# Patient Record
Sex: Male | Born: 1985 | Race: Black or African American | Hispanic: No | Marital: Single | Smoking: Never smoker
Health system: Southern US, Community
[De-identification: ages and names within clinical notes are randomized; demographics above are authoritative.]

## PROBLEM LIST (undated history)

## (undated) DIAGNOSIS — B20 Human immunodeficiency virus [HIV] disease: Secondary | ICD-10-CM

## (undated) DIAGNOSIS — Z21 Asymptomatic human immunodeficiency virus [HIV] infection status: Secondary | ICD-10-CM

---

## 2007-09-19 ENCOUNTER — Emergency Department (HOSPITAL_COMMUNITY): Admission: EM | Admit: 2007-09-19 | Discharge: 2007-09-20 | Payer: Self-pay | Admitting: Emergency Medicine

## 2008-03-30 ENCOUNTER — Emergency Department (HOSPITAL_COMMUNITY): Admission: EM | Admit: 2008-03-30 | Discharge: 2008-03-30 | Payer: Self-pay | Admitting: Emergency Medicine

## 2008-04-25 ENCOUNTER — Emergency Department (HOSPITAL_COMMUNITY): Admission: EM | Admit: 2008-04-25 | Discharge: 2008-04-25 | Payer: Self-pay | Admitting: Emergency Medicine

## 2009-09-26 ENCOUNTER — Emergency Department (HOSPITAL_COMMUNITY): Admission: EM | Admit: 2009-09-26 | Discharge: 2009-09-26 | Payer: Self-pay | Admitting: Emergency Medicine

## 2010-08-23 LAB — CONVERTED CEMR LAB
CD4 Count: 532 microliters
CD4 T Helper %: 26.6 %
Hemoglobin: 13.1 g/dL
WBC: 3.5 10*3/uL

## 2010-10-31 ENCOUNTER — Ambulatory Visit: Payer: Self-pay | Admitting: Adult Health

## 2010-10-31 DIAGNOSIS — B20 Human immunodeficiency virus [HIV] disease: Secondary | ICD-10-CM

## 2010-10-31 LAB — CONVERTED CEMR LAB
ALT: 24 units/L (ref 0–53)
Alkaline Phosphatase: 38 units/L — ABNORMAL LOW (ref 39–117)
Bacteria, UA: NONE SEEN
Basophils Relative: 1 % (ref 0–1)
Bilirubin Urine: NEGATIVE
CO2: 28 meq/L (ref 19–32)
Chlamydia, Swab/Urine, PCR: NEGATIVE
Cholesterol: 142 mg/dL (ref 0–200)
Creatinine, Ser: 0.98 mg/dL (ref 0.40–1.50)
Eosinophils Absolute: 0 10*3/uL (ref 0.0–0.7)
Eosinophils Relative: 1 % (ref 0–5)
GC Probe Amp, Urine: NEGATIVE
HCT: 40.3 % (ref 39.0–52.0)
HIV 1 RNA Quant: 13400 copies/mL — ABNORMAL HIGH (ref <20)
HIV-1 RNA Quant, Log: 4.13 — ABNORMAL HIGH (ref <1.30)
Hep A Total Ab: POSITIVE — AB
Hep B Core Total Ab: NEGATIVE
Hep B S Ab: POSITIVE — AB
Ketones, ur: NEGATIVE mg/dL
LDL Cholesterol: 94 mg/dL (ref 0–99)
Lymphs Abs: 2.6 10*3/uL (ref 0.7–4.0)
MCHC: 32.8 g/dL (ref 30.0–36.0)
MCV: 85 fL (ref 78.0–100.0)
Monocytes Relative: 9 % (ref 3–12)
Platelets: 218 10*3/uL (ref 150–400)
Protein, ur: NEGATIVE mg/dL
Sodium: 138 meq/L (ref 135–145)
Squamous Epithelial / LPF: NONE SEEN /lpf
Total Bilirubin: 0.5 mg/dL (ref 0.3–1.2)
Total Protein: 8.4 g/dL — ABNORMAL HIGH (ref 6.0–8.3)
Urine Glucose: NEGATIVE mg/dL
Urobilinogen, UA: 0.2 (ref 0.0–1.0)
VLDL: 10 mg/dL (ref 0–40)
WBC: 4.6 10*3/uL (ref 4.0–10.5)

## 2010-11-14 ENCOUNTER — Ambulatory Visit: Payer: Self-pay | Admitting: Adult Health

## 2010-11-14 DIAGNOSIS — Z860100 Personal history of colon polyps, unspecified: Secondary | ICD-10-CM | POA: Insufficient documentation

## 2010-11-14 DIAGNOSIS — Z87442 Personal history of urinary calculi: Secondary | ICD-10-CM | POA: Insufficient documentation

## 2010-11-15 ENCOUNTER — Encounter: Payer: Self-pay | Admitting: Adult Health

## 2010-12-07 ENCOUNTER — Encounter: Payer: Self-pay | Admitting: Adult Health

## 2010-12-22 ENCOUNTER — Encounter: Payer: Self-pay | Admitting: Adult Health

## 2011-01-16 NOTE — Miscellaneous (Signed)
Summary: Center Immunizations Registry   [facility] Immunizations Registry   Imported By: Florinda Marker 11/15/2010 09:56:32  _____________________________________________________________________  External Attachment:    Type:   Image     Comment:   External Document

## 2011-01-16 NOTE — Assessment & Plan Note (Signed)
Summary: Nurse Visit (Infectious Disease)   Infectious Disease New Patient Intake Referring MD/Agency: [facility] Dept  Address: [address] [place], [place] [address]  Return Appointment Date: 11/15/2010  With Physician: Sundra Aland ,NP Medical Records: Received Health Insurance / Payor: No Insurance Does insurance cover prescriptions? Yes Our patient has been informed that medication assistance programs are available.  Our Co-ordinator will be meeting with the patient during this visit to discuss financial and medication assistance.   Do you have a Primary physician: No Are family members aware of patient's diagnosis?  If so, are they supportive? None  Medical History  Family History Hypertension: Yes  Family Side: Maternal Diabetes: Yes  Family Side: Maternal Cancer: Yes  Family Side: Paternal  Comments: Grandfather Colon CA  Tobacco Use: previous  Behavioral Health Assessment Have you ever been diagnosed with depression or mental illness? No  Do you drink alcohol? No Do you use recreational drugs? No Do you feel you have a problem with drugs and/or alcohol? No   Have you ever been in a treatment facility for any addiction? No If you are currently using drugs and/or alcohol, would you like help for this addiction? No  HIV Intake Information When did you first test positive for HIV? 08/03/2010 Type of test Conducted: WB   Where was this test performed?  Name of Agency: [facility]  City/State: [place], [place] Was this your first time ever being tested or HIV? No Risk Factor(s) for HIV: MSM  Method of Exposure to HIV: Homosexual Intercourse-Receptive Homosexual Intercourse-Insertive Have you ever been hospitalized for any HIV-related condition? No   Newly Diagnosed Patients Has a Disease Intervention Specialist from the Health Department contacted the patient? Yes.   The patient has been informed that the [facility] will  contact ALL newly reported cases. 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  Infection History  Patient has been diagnosed with the following opportunistic infections: Are there any other symptoms you need to discuss? No Have you received literature/education prior to this visit about HIV/AIDS? Yes Do you understand the meaning of a Viral Load? No Do you understand the meaning of a CD4 count? No Initial CD4 Result: 532 Date: 08/23/2010 Lab Values Education/Handout Given Yes Medication Education/Handout Given Yes  Sexual History Are you in a current relationship? Yes How long have you been in this relationship? 1 year Are they aware of your diagnosis? Yes Have they been tested for HIV? Yes What were the results: Negative Details: Last 4-5 months oral sex only Are you currently sexually active? No Was this protected intercourse? No Safe Sex Counseling/Pamphlet Given Sexual History Comments: 1 male partner in the last year w/o protection  lifetime partners : 1 male partners  No sex with females   Evaluation and Follow-Up INTAKE CHECK LIST: HIV Education, Safe Sex Counseling, Juanell Fairly Consent  Prevention For Positives: 10/31/2010   Safe sex practices discussed with patient. Condoms offered. Are you in need of condoms at this time? No Our patient has been informed that condoms are always available in this clinic.   Brochure Provided for Above Organizations? Yes Name of Agency: [facility]  Immunization History:  Tetanus/Td Immunization History:    Tetanus/Td:  historical (08/23/2010)    Tetanus/Td:  tdap (08/23/2010)    Tetanus/Td:  historical (08/23/2010)    Tetanus/Td:  tdap  (08/23/2010)  Pneumovax Immunization History:    Pneumovax:  historical (08/23/2010)  Hepatitis A Immunization History:    Hepatitis A # 1:  historical (08/23/2010)  Hepatitis B Immunization History:    Hepatitis B # 1:  historical (08/23/2010)  PPD Results    Date of reading: 04/20/2010    Results: < 5mm    Interpretation: negative            Prevention For Positives: 10/31/2010   Safe sex practices discussed with patient. Condoms offered.        10/31/2010   Patient was screened for substance abuse and depression. Referal was made as indicated.                      -  Date:  08/23/2010    CD4: 532    CD4%: 26.6    Hemoglobin: 13.1    WBC: 3.5    Platelets: 216

## 2011-01-16 NOTE — Miscellaneous (Signed)
Summary: HIPAA Restrictions  HIPAA Restrictions   Imported By: Florinda Marker 11/01/2010 16:13:00  _____________________________________________________________________  External Attachment:    Type:   Image     Comment:   External Document

## 2011-01-18 NOTE — Consult Note (Signed)
Summary: New Pt. Referral: [facility] 12/22  New Pt. Referral: [facility] 12/22   Imported By: Florinda Marker 12/07/2010 10:22:56  _____________________________________________________________________  External Attachment:    Type:   Image     Comment:   External Document

## 2011-01-18 NOTE — Miscellaneous (Signed)
Summary: RW update  Clinical Lists Changes  Observations: Added new observation of RWPARTICIP: Yes (12/22/2010 13:47)

## 2011-01-18 NOTE — Letter (Signed)
Summary: TB Skin  Test  TB Skin  Test   Imported By: Florinda Marker 12/07/2010 10:20:24  _____________________________________________________________________  External Attachment:    Type:   Image     Comment:   External Document

## 2011-01-18 NOTE — Assessment & Plan Note (Addendum)
Summary: New042/tkk   Visit Type:  New Patient  CC:  new pt. to establish and lab results.  History of Present Illness: 25 y/o African-American male with newly diagnosed HIV in September 2011 in for initial evaluation post intake.  He presents with no current physical complaints, denies any prior illnesses associated with HIV, and claims to be actually feeling better now than he did 1 year back.  He denies any depression or associated adjustment issues with his diagnosis and expresses interest in how best to care for himself.  Allergies (verified): No Known Drug Allergies   Preventive Screening-Counseling & Management  Alcohol-Tobacco     Alcohol drinks/day: 0     Smoking Status: never  Caffeine-Diet-Exercise     Caffeine use/day: occasional tea     Does Patient Exercise: yes     Type of exercise: treadmill, cardio     Exercise (avg: min/session): 30-60     Times/week: 3  Safety-Violence-Falls     Seat Belt Use: yes      Sexual History:  currently monogamous.        Drug Use:  no.    Comments: pt. declined condoms   Current Allergies (reviewed today): No known allergies  Past History:  Past Medical History: Nephrolithiasis, hx of Hx of colonic polyps  Past Surgical History: Removal colonic polyps, 2005  Family History: Family History of Alcoholism/Addiction Family History of Anemia/FE deficiency Family History of Asthma Family History Diabetes 1st degree relative Family History Hypertension Family History of Suicide attempt Family History Weight disorder  Social History: Occupation: Occupational hygienist Never Smoked Alcohol use-no Drug use-no Regular exercise-yes Sexual History:  currently monogamous Drug Use:  no Education:  Postgraduate Marital Status:  Never Married Religion:  Christian Protestant  Review of Systems General:  Denies chills, fatigue, fever, loss of appetite, malaise, sleep disorder, sweats, weakness, and weight  loss. Eyes:  Denies blurring, discharge, double vision, eye irritation, eye pain, halos, itching, light sensitivity, red eye, vision loss-1 eye, and vision loss-both eyes; Does feel he needs eval for corrective lenses. ENT:  Denies decreased hearing, difficulty swallowing, ear discharge, earache, hoarseness, nasal congestion, nosebleeds, postnasal drainage, ringing in ears, sinus pressure, and sore throat. CV:  Denies bluish discoloration of lips or nails, chest pain or discomfort, difficulty breathing at night, difficulty breathing while lying down, fainting, fatigue, leg cramps with exertion, lightheadness, near fainting, palpitations, shortness of breath with exertion, swelling of feet, swelling of hands, and weight gain. Resp:  Denies chest discomfort, chest pain with inspiration, cough, coughing up blood, excessive snoring, hypersomnolence, morning headaches, pleuritic, shortness of breath, sputum productive, and wheezing. GI:  Denies abdominal pain, bloody stools, change in bowel habits, constipation, dark tarry stools, diarrhea, excessive appetite, gas, hemorrhoids, indigestion, loss of appetite, nausea, vomiting, vomiting blood, and yellowish skin color. GU:  Denies decreased libido, discharge, dysuria, erectile dysfunction, genital sores, hematuria, incontinence, nocturia, urinary frequency, and urinary hesitancy. MS:  Complains of joint pain; denies joint redness, joint swelling, loss of strength, low back pain, mid back pain, muscle aches, muscle , cramps, muscle weakness, stiffness, and thoracic pain; Has hx of chronic inflammation, but uncertain if he was told it was RA v. OA. Derm:  Denies changes in color of skin, changes in nail beds, dryness, excessive perspiration, flushing, hair loss, insect bite(s), itching, lesion(s), poor wound healing, and rash. Neuro:  Denies brief paralysis, difficulty with concentration, disturbances in coordination, falling down, headaches, inability to speak,  memory loss, numbness, poor balance, seizures,  sensation of room spinning, tingling, tremors, visual disturbances, and weakness. Psych:  Denies alternate hallucination ( auditory/visual), anxiety, depression, easily angered, easily tearful, irritability, mental problems, panic attacks, sense of great danger, suicidal thoughts/plans, thoughts of violence, unusual visions or sounds, and thoughts /plans of harming others. Endo:  Denies cold intolerance, excessive hunger, excessive thirst, excessive urination, heat intolerance, polyuria, and weight change. Heme:  Denies abnormal bruising, bleeding, enlarge lymph nodes, fevers, pallor, and skin discoloration. Allergy:  Denies hives or rash, itching eyes, persistent infections, seasonal allergies, and sneezing. Exposures:  Denies EBV exposure, TB exposure, exposure to sick animals, exposure to sick people, exposure to unusual animals, exposure to small children, exposure to caves/spelunking, exposure to bats, exposure to hunting/wild game, exposure to stagnant or pond water, exposure to salt water, exposure to marine animals/shellfish, animal bites, cat scratches, tick bites, eating raw eggs, eating raw chicken, eating raw fish/shellfish, blood transfusion, ingestion of well water, water vapor exposure, history of needle use/puncture, history of antibiotic use (last 2 mo.), and history of travel.  Vital Signs:  Patient profile:   25 year old male Height:      71.5 inches (181.61 cm) Weight:      178.0 pounds (80.91 kg) BMI:     24.57 Temp:     98.4 degrees F (36.89 degrees C) oral Pulse rate:   114 / minute BP sitting:   152 / 80  (right arm)  Vitals Entered By: Wendall Mola CMA Duncan Dull) (November 14, 2010 9:11 AM) CC: new pt. to establish, lab results Is Patient Diabetic? No Pain Assessment Patient in pain? no      Nutritional Status BMI of 19 -24 = normal Nutritional Status Detail appetite "good"  Have you ever been in a relationship  where you felt threatened, hurt or afraid?No   Does patient need assistance? Functional Status Self care Ambulation Normal   Physical Exam  General:  Well-developed,well-nourished,in no acute distress; alert,appropriate and cooperative throughout examination Head:  Normocephalic and atraumatic without obvious abnormalities. No apparent alopecia or balding. Eyes:  No corneal or conjunctival inflammation noted. EOMI. Perrla. Funduscopic exam benign, without hemorrhages, exudates or papilledema. Vision grossly normal. Ears:  External ear exam shows no significant lesions or deformities.  Otoscopic examination reveals clear canals, tympanic membranes are intact bilaterally without bulging, retraction, inflammation or discharge. Hearing is grossly normal bilaterally. Nose:  External nasal examination shows no deformity or inflammation. Nasal mucosa are pink and moist without lesions or exudates. Mouth:  Oral mucosa and oropharynx without lesions or exudates.  Teeth in good repair. Neck:  No deformities, masses, or tenderness noted. Chest Wall:  No deformities, masses, tenderness or gynecomastia noted. Lungs:  Normal respiratory effort, chest expands symmetrically. Lungs are clear to auscultation, no crackles or wheezes. Heart:  Normal rate and regular rhythm. S1 and S2 normal without gallop, murmur, click, rub or other extra sounds. Abdomen:  Bowel sounds positive,abdomen soft and non-tender without masses, organomegaly or hernias noted. Rectal:  No external abnormalities noted. Normal sphincter tone. No rectal masses or tenderness. Genitalia:  Testes bilaterally descended without nodularity, tenderness or masses. No scrotal masses or lesions. No penis lesions or urethral discharge. Prostate:  Deferred Msk:  No deformity or scoliosis noted of thoracic or lumbar spine.   Pulses:  R and L carotid,radial,femoral,dorsalis pedis and posterior tibial pulses are full and equal bilaterally Extremities:   No clubbing, cyanosis, edema, or deformity noted with normal full range of motion of all joints.   Neurologic:  No cranial nerve deficits noted. Station and gait are normal. Plantar reflexes are down-going bilaterally. DTRs are symmetrical throughout. Sensory, motor and coordinative functions appear intact. Skin:  Intact without suspicious lesions or rashes Cervical Nodes:  No lymphadenopathy noted Axillary Nodes:  No palpable lymphadenopathy Inguinal Nodes:  No significant adenopathy Psych:  Cognition and judgment appear intact. Alert and cooperative with normal attention span and concentration. No apparent delusions, illusions, hallucinations   Impression & Recommendations:  Problem # 1:  HIV INFECTION (ICD-042) CD4 525@25 % with HIV RNA 13,400 copies/ml.  Genotype showing WT HIV.  Discussed in detail HIV pathogenesis, treatment goals, potential regimens with drug SE's, ADR"s, and potential toxicities.  At this point, after careful counselling, he has opted atthis point not to begin ARV therapy, and will wait until second evaluation in 3 months.  We will schedule lab draw for 10 weeks with full RTC in 12 weeks.  Agreed with this plan. Orders: New Patient Level IV (19147)  Other Orders: Hepatitis B Vaccine >33yrs [phone]) Admin 1st Vaccine (21308)  Patient Instructions: 1)  Please schedule a follow-up appointment in 3 months. 2)  Be sure to return for lab work one (1) week before your next appointment as scheduled.     Immunizations Administered:  Hepatitis B Vaccine # 2:    Vaccine Type: HepB Adult    Site: left deltoid    Mfr: Merck    Dose: 0.5 ml    Route: IM    Given by: Wendall Mola CMA ( AAMA)    Exp. Date: 12/04/2012    Lot #: [id number]    VIS given: 07/03/06 version given November 14, 2010.

## 2011-02-14 ENCOUNTER — Other Ambulatory Visit (INDEPENDENT_AMBULATORY_CARE_PROVIDER_SITE_OTHER): Payer: Self-pay

## 2011-02-14 ENCOUNTER — Other Ambulatory Visit: Payer: Self-pay | Admitting: Infectious Diseases

## 2011-02-14 ENCOUNTER — Encounter: Payer: Self-pay | Admitting: Infectious Diseases

## 2011-02-14 ENCOUNTER — Encounter: Payer: Self-pay | Admitting: Adult Health

## 2011-02-14 DIAGNOSIS — B20 Human immunodeficiency virus [HIV] disease: Secondary | ICD-10-CM

## 2011-02-14 LAB — CONVERTED CEMR LAB
ALT: 19 units/L (ref 0–53)
AST: 18 units/L (ref 0–37)
Alkaline Phosphatase: 38 units/L — ABNORMAL LOW (ref 39–117)
Basophils Relative: 0 % (ref 0–1)
Eosinophils Absolute: 0.1 10*3/uL (ref 0.0–0.7)
HIV-1 RNA Quant, Log: 3.99 — ABNORMAL HIGH (ref <1.30)
MCHC: 33.5 g/dL (ref 30.0–36.0)
MCV: 82.4 fL (ref 78.0–100.0)
Monocytes Relative: 7 % (ref 3–12)
Neutrophils Relative %: 28 % — ABNORMAL LOW (ref 43–77)
Platelets: 220 10*3/uL (ref 150–400)
RBC: 4.82 M/uL (ref 4.22–5.81)
RDW: 13.2 % (ref 11.5–15.5)
Sodium: 136 meq/L (ref 135–145)
Total Bilirubin: 0.5 mg/dL (ref 0.3–1.2)
Total Protein: 8.7 g/dL — ABNORMAL HIGH (ref 6.0–8.3)

## 2011-02-15 LAB — T-HELPER CELL (CD4) - (RCID CLINIC ONLY): CD4 % Helper T Cell: 22 % — ABNORMAL LOW (ref 33–55)

## 2011-02-28 ENCOUNTER — Ambulatory Visit: Payer: Self-pay | Admitting: Adult Health

## 2011-03-02 ENCOUNTER — Ambulatory Visit: Payer: Self-pay

## 2011-03-07 ENCOUNTER — Ambulatory Visit: Payer: Self-pay

## 2011-03-28 ENCOUNTER — Ambulatory Visit: Payer: Self-pay

## 2011-05-30 ENCOUNTER — Other Ambulatory Visit: Payer: Self-pay

## 2011-05-30 DIAGNOSIS — Z113 Encounter for screening for infections with a predominantly sexual mode of transmission: Secondary | ICD-10-CM

## 2011-05-30 DIAGNOSIS — B20 Human immunodeficiency virus [HIV] disease: Secondary | ICD-10-CM

## 2011-05-30 DIAGNOSIS — Z79899 Other long term (current) drug therapy: Secondary | ICD-10-CM

## 2011-06-13 ENCOUNTER — Other Ambulatory Visit: Payer: Self-pay | Admitting: Infectious Diseases

## 2011-06-13 ENCOUNTER — Ambulatory Visit: Payer: Self-pay | Admitting: Adult Health

## 2011-06-13 ENCOUNTER — Other Ambulatory Visit: Payer: Self-pay

## 2011-06-13 DIAGNOSIS — Z79899 Other long term (current) drug therapy: Secondary | ICD-10-CM

## 2011-06-13 DIAGNOSIS — B20 Human immunodeficiency virus [HIV] disease: Secondary | ICD-10-CM

## 2011-06-13 DIAGNOSIS — Z113 Encounter for screening for infections with a predominantly sexual mode of transmission: Secondary | ICD-10-CM

## 2011-06-14 LAB — CBC WITH DIFFERENTIAL/PLATELET
Eosinophils Absolute: 0 10*3/uL (ref 0.0–0.7)
Eosinophils Relative: 1 % (ref 0–5)
Lymphs Abs: 3 10*3/uL (ref 0.7–4.0)
MCH: 27.5 pg (ref 26.0–34.0)
MCV: 83.3 fL (ref 78.0–100.0)
Monocytes Relative: 6 % (ref 3–12)
Platelets: 232 10*3/uL (ref 150–400)
RBC: 4.98 MIL/uL (ref 4.22–5.81)

## 2011-06-14 LAB — LIPID PANEL
HDL: 36 mg/dL — ABNORMAL LOW (ref >39)
Total CHOL/HDL Ratio: 3.9 Ratio
VLDL: 10 mg/dL (ref 0–40)

## 2011-06-14 LAB — URINALYSIS, ROUTINE W REFLEX MICROSCOPIC
Bilirubin Urine: NEGATIVE
Glucose, UA: NEGATIVE mg/dL
Protein, ur: NEGATIVE mg/dL

## 2011-06-14 LAB — T-HELPER CELL (CD4) - (RCID CLINIC ONLY)
CD4 % Helper T Cell: 23 % — ABNORMAL LOW (ref 33–55)
CD4 T Cell Abs: 720 uL (ref 400–2700)

## 2011-06-14 LAB — COMPLETE METABOLIC PANEL WITH GFR
BUN: 11 mg/dL (ref 6–23)
CO2: 29 mEq/L (ref 19–32)
Creat: 1.1 mg/dL (ref 0.50–1.35)
GFR, Est African American: >60 mL/min (ref >60)
GFR, Est Non African American: >60 mL/min (ref >60)
Glucose, Bld: 83 mg/dL (ref 70–99)
Total Bilirubin: 0.6 mg/dL (ref 0.3–1.2)

## 2011-06-14 LAB — URINALYSIS, MICROSCOPIC ONLY: Bacteria, UA: NONE SEEN

## 2011-06-14 LAB — RPR

## 2011-06-14 LAB — HIV-1 RNA QUANT-NO REFLEX-BLD
HIV 1 RNA Quant: 6020 copies/mL — ABNORMAL HIGH (ref <20)
HIV-1 RNA Quant, Log: 3.78 {Log} — ABNORMAL HIGH (ref <1.30)

## 2011-06-27 ENCOUNTER — Encounter: Payer: Self-pay | Admitting: Adult Health

## 2011-06-27 ENCOUNTER — Ambulatory Visit (INDEPENDENT_AMBULATORY_CARE_PROVIDER_SITE_OTHER): Payer: Self-pay | Admitting: Adult Health

## 2011-06-27 VITALS — BP 160/85 | HR 96 | Temp 98.3°F | Ht 70.0 in | Wt 168.0 lb

## 2011-06-27 DIAGNOSIS — B20 Human immunodeficiency virus [HIV] disease: Secondary | ICD-10-CM

## 2011-09-11 LAB — URINALYSIS, ROUTINE W REFLEX MICROSCOPIC
Glucose, UA: NEGATIVE
Leukocytes, UA: NEGATIVE
Nitrite: NEGATIVE
Protein, ur: NEGATIVE

## 2011-09-11 LAB — BASIC METABOLIC PANEL
BUN: 12
CO2: 31
Chloride: 105
Creatinine, Ser: 0.91
Glucose, Bld: 96

## 2011-09-11 LAB — DIFFERENTIAL
Basophils Absolute: 0
Basophils Relative: 1
Eosinophils Absolute: 0.2
Eosinophils Relative: 3
Neutrophils Relative %: 44

## 2011-09-11 LAB — CBC
HCT: 39.4
MCHC: 33.2
MCV: 84
Platelets: 221
RDW: 14.1
WBC: 7.5

## 2011-09-11 LAB — GC/CHLAMYDIA PROBE AMP, GENITAL: Chlamydia, DNA Probe: NEGATIVE

## 2011-09-11 LAB — URINE MICROSCOPIC-ADD ON

## 2011-11-15 ENCOUNTER — Encounter: Payer: Self-pay | Admitting: *Deleted

## 2011-11-26 ENCOUNTER — Other Ambulatory Visit (INDEPENDENT_AMBULATORY_CARE_PROVIDER_SITE_OTHER): Payer: Self-pay

## 2011-11-26 ENCOUNTER — Other Ambulatory Visit: Payer: Self-pay | Admitting: Infectious Diseases

## 2011-11-26 DIAGNOSIS — B20 Human immunodeficiency virus [HIV] disease: Secondary | ICD-10-CM

## 2011-11-26 DIAGNOSIS — Z113 Encounter for screening for infections with a predominantly sexual mode of transmission: Secondary | ICD-10-CM

## 2011-11-26 DIAGNOSIS — Z112 Encounter for screening for other bacterial diseases: Secondary | ICD-10-CM

## 2011-11-26 DIAGNOSIS — Z79899 Other long term (current) drug therapy: Secondary | ICD-10-CM

## 2011-11-26 LAB — URINALYSIS, ROUTINE W REFLEX MICROSCOPIC
Leukocytes, UA: NEGATIVE
Nitrite: NEGATIVE
Specific Gravity, Urine: 1.008 (ref 1.005–1.030)
Urobilinogen, UA: 0.2 mg/dL (ref 0.0–1.0)
pH: 7 (ref 5.0–8.0)

## 2011-11-26 LAB — RPR

## 2011-11-26 LAB — COMPLETE METABOLIC PANEL WITH GFR
ALT: 21 U/L (ref 0–53)
AST: 33 U/L (ref 0–37)
CO2: 27 mEq/L (ref 19–32)
Chloride: 102 mEq/L (ref 96–112)
Creat: 1 mg/dL (ref 0.50–1.35)
GFR, Est African American: >89 mL/min
Sodium: 138 mEq/L (ref 135–145)
Total Bilirubin: 0.4 mg/dL (ref 0.3–1.2)
Total Protein: 8.2 g/dL (ref 6.0–8.3)

## 2011-11-26 LAB — CBC WITH DIFFERENTIAL/PLATELET
Basophils Absolute: 0 10*3/uL (ref 0.0–0.1)
Eosinophils Relative: 1 % (ref 0–5)
Lymphocytes Relative: 46 % (ref 12–46)
Lymphs Abs: 2 10*3/uL (ref 0.7–4.0)
Neutrophils Relative %: 42 % — ABNORMAL LOW (ref 43–77)
Platelets: 212 10*3/uL (ref 150–400)
RBC: 4.77 MIL/uL (ref 4.22–5.81)
RDW: 13.5 % (ref 11.5–15.5)
WBC: 4.3 10*3/uL (ref 4.0–10.5)

## 2011-11-26 LAB — URINALYSIS, MICROSCOPIC ONLY: Squamous Epithelial / LPF: NONE SEEN

## 2011-11-26 LAB — LIPID PANEL: Cholesterol: 119 mg/dL (ref 0–200)

## 2011-11-27 LAB — T-HELPER CELL (CD4) - (RCID CLINIC ONLY): CD4 % Helper T Cell: 24 % — ABNORMAL LOW (ref 33–55)

## 2011-11-27 LAB — GC/CHLAMYDIA PROBE AMP, URINE
Chlamydia, Swab/Urine, PCR: NEGATIVE
GC Probe Amp, Urine: NEGATIVE

## 2011-11-28 ENCOUNTER — Other Ambulatory Visit: Payer: Self-pay | Admitting: Infectious Diseases

## 2011-11-28 DIAGNOSIS — R319 Hematuria, unspecified: Secondary | ICD-10-CM

## 2011-11-28 LAB — HIV-1 RNA QUANT-NO REFLEX-BLD: HIV-1 RNA Quant, Log: 3.74 {Log} — ABNORMAL HIGH (ref <1.30)

## 2011-11-28 NOTE — Progress Notes (Signed)
Patient given lab appointment

## 2011-12-05 ENCOUNTER — Telehealth: Payer: Self-pay

## 2011-12-05 ENCOUNTER — Other Ambulatory Visit (INDEPENDENT_AMBULATORY_CARE_PROVIDER_SITE_OTHER): Payer: Self-pay

## 2011-12-05 ENCOUNTER — Other Ambulatory Visit: Payer: Self-pay | Admitting: Infectious Diseases

## 2011-12-05 DIAGNOSIS — R319 Hematuria, unspecified: Secondary | ICD-10-CM

## 2011-12-05 LAB — URINALYSIS, MICROSCOPIC ONLY
Crystals: NONE SEEN
Squamous Epithelial / LPF: NONE SEEN

## 2011-12-05 LAB — URINALYSIS, ROUTINE W REFLEX MICROSCOPIC
Bilirubin Urine: NEGATIVE
Leukocytes, UA: NEGATIVE
Protein, ur: NEGATIVE mg/dL
Specific Gravity, Urine: 1.018 (ref 1.005–1.030)
Urobilinogen, UA: 0.2 mg/dL (ref 0.0–1.0)

## 2011-12-05 NOTE — Telephone Encounter (Signed)
Left message for patient concerning Bradley Mullen financial assistance - advised him to call back to make appt.

## 2012-01-23 NOTE — Progress Notes (Signed)
Subjective:    Patient ID: Bradley Mullen is a 26 y.o. male.  Chief Complaint: HIV Follow-up Visit Chez Bulnes is here for follow-up of HIV infection. He is feeling unchanged since his last visit.  He currently remains without HIV therapy per his choice.  There are not additional complaints.   Data Review: Diagnostic studies reviewed.  Review of Systems - General ROS: negative for - fatigue, fever or malaise Psychological ROS: negative for - anxiety, behavioral disorder, concentration difficulties, depression or mood swings Ophthalmic ROS: negative ENT ROS: negative Respiratory ROS: no cough, shortness of breath, or wheezing Cardiovascular ROS: no chest pain or dyspnea on exertion Gastrointestinal ROS: no abdominal pain, change in bowel habits, or black or bloody stools Neurological ROS: no TIA or stroke symptoms Dermatological ROS: negative for rash and skin lesion changes  Objective:   General appearance: alert, cooperative and no distress Head: Normocephalic, without obvious abnormality, atraumatic Eyes: conjunctivae/corneas clear. PERRL, EOM's intact. Fundi benign. Ears: normal TM's and external ear canals both ears Throat: lips, mucosa, and tongue normal; teeth and gums normal Resp: clear to auscultation bilaterally Cardio: regular rate and rhythm, S1, S2 normal, no murmur, click, rub or gallop GI: soft, non-tender; bowel sounds normal; no masses,  no organomegaly Extremities: extremities normal, atraumatic, no cyanosis or edema Pulses: 2+ and symmetric Skin: Skin color, texture, turgor normal. No rashes or lesions Neurologic: Grossly normal Psych:  No vegetative signs or delusional behaviors noted.    Laboratory: From 06/13/2011 ,  CD4 count was 720 c/cmm @ 23 %. Viral load  6020 copies/ml.     Assessment/Plan:   HIV INFECTION Although he remains off therapy, his CD4 count is relatively stable. However, we discussed new treatment guidelines, and the reasons HIV  treatment is now recommended for any individual with a detectable viral load. In spite of these discussions, he remains reticent to begin therapy, and has asked not to start treatment at this time. We will further readdress this when he returns for followup in 4 months with labs 2 weeks before this next appointment. He verbally acknowledged all information that was provided to him and agreed with plan of care.     Embry Manrique A. Sundra Aland, MS, [facility] for Infectious Disease [phone]  01/23/2012, 4:38 PM

## 2012-01-23 NOTE — Assessment & Plan Note (Signed)
Although he remains off therapy, his CD4 count is relatively stable. However, we discussed new treatment guidelines, and the reasons HIV treatment is now recommended for any individual with a detectable viral load. In spite of these discussions, he remains reticent to begin therapy, and has asked not to start treatment at this time. We will further readdress this when he returns for followup in 4 months with labs 2 weeks before this next appointment. He verbally acknowledged all information that was provided to him and agreed with plan of care.

## 2012-05-07 ENCOUNTER — Telehealth: Payer: Self-pay | Admitting: *Deleted

## 2012-05-07 NOTE — Telephone Encounter (Signed)
I LM on his phone asking him to call & make an appt

## 2012-11-17 ENCOUNTER — Other Ambulatory Visit: Payer: Self-pay

## 2012-11-17 ENCOUNTER — Ambulatory Visit: Payer: Self-pay

## 2012-11-27 ENCOUNTER — Ambulatory Visit: Payer: Self-pay | Admitting: Internal Medicine

## 2012-12-22 ENCOUNTER — Other Ambulatory Visit (INDEPENDENT_AMBULATORY_CARE_PROVIDER_SITE_OTHER): Payer: BC Managed Care – PPO

## 2012-12-22 ENCOUNTER — Ambulatory Visit: Payer: Self-pay

## 2012-12-22 DIAGNOSIS — B20 Human immunodeficiency virus [HIV] disease: Secondary | ICD-10-CM

## 2012-12-22 DIAGNOSIS — Z113 Encounter for screening for infections with a predominantly sexual mode of transmission: Secondary | ICD-10-CM

## 2012-12-22 DIAGNOSIS — Z79899 Other long term (current) drug therapy: Secondary | ICD-10-CM

## 2012-12-22 LAB — COMPREHENSIVE METABOLIC PANEL
ALT: 20 U/L (ref 0–53)
Albumin: 4.5 g/dL (ref 3.5–5.2)
CO2: 25 mEq/L (ref 19–32)
Calcium: 9.3 mg/dL (ref 8.4–10.5)
Chloride: 103 mEq/L (ref 96–112)
Creat: 1.12 mg/dL (ref 0.50–1.35)
Potassium: 4.3 mEq/L (ref 3.5–5.3)
Total Protein: 8.3 g/dL (ref 6.0–8.3)

## 2012-12-22 LAB — LIPID PANEL
LDL Cholesterol: 104 mg/dL — ABNORMAL HIGH (ref 0–99)
Triglycerides: 46 mg/dL (ref <150)
VLDL: 9 mg/dL (ref 0–40)

## 2012-12-22 LAB — RPR

## 2012-12-22 LAB — CBC WITH DIFFERENTIAL/PLATELET
Eosinophils Relative: 2 % (ref 0–5)
HCT: 40.9 % (ref 39.0–52.0)
Lymphocytes Relative: 55 % — ABNORMAL HIGH (ref 12–46)
Lymphs Abs: 2.8 10*3/uL (ref 0.7–4.0)
MCV: 81.5 fL (ref 78.0–100.0)
Platelets: 204 10*3/uL (ref 150–400)
RBC: 5.02 MIL/uL (ref 4.22–5.81)
WBC: 5.1 10*3/uL (ref 4.0–10.5)

## 2012-12-23 LAB — T-HELPER CELL (CD4) - (RCID CLINIC ONLY)
CD4 % Helper T Cell: 23 % — ABNORMAL LOW (ref 33–55)
CD4 T Cell Abs: 640 uL (ref 400–2700)

## 2013-01-06 ENCOUNTER — Encounter: Payer: Self-pay | Admitting: Internal Medicine

## 2013-01-06 ENCOUNTER — Ambulatory Visit (INDEPENDENT_AMBULATORY_CARE_PROVIDER_SITE_OTHER): Payer: BC Managed Care – PPO | Admitting: Internal Medicine

## 2013-01-06 VITALS — BP 158/82 | HR 76 | Temp 98.1°F | Ht 71.0 in | Wt 185.0 lb

## 2013-01-06 DIAGNOSIS — B20 Human immunodeficiency virus [HIV] disease: Secondary | ICD-10-CM

## 2013-01-06 DIAGNOSIS — Z21 Asymptomatic human immunodeficiency virus [HIV] infection status: Secondary | ICD-10-CM

## 2013-01-06 NOTE — Progress Notes (Signed)
  Subjective:    Patient ID: Bradley Mullen, male    DOB: [birth year], 27 y.o.   MRN: [id number]  HPI He comes in for followup of his HIV. He has been seen in the past after diagnosis however has been hesitant to start therapy. He does not voice any particular concerns with starting therapy or have any particular questions, he just states that she has not been ready. In discussion, it is apparent that he does understand the benefits and the risks of both therapy as well as not being on therapy. He is at this time interested in considering therapy over he remains hesitant. He has no new issues since his last visit.  He refuses the flu shot.   Review of Systems  Constitutional: Negative for fever, activity change, fatigue and unexpected weight change.  HENT: Negative for sore throat and trouble swallowing.   Respiratory: Negative for cough and shortness of breath.   Cardiovascular: Negative for chest pain, palpitations and leg swelling.  Gastrointestinal: Negative for nausea, abdominal pain and diarrhea.  Musculoskeletal: Negative for myalgias, joint swelling and arthralgias.  Skin: Negative for rash.  Neurological: Negative for dizziness and headaches.       Objective:   Physical Exam  Constitutional: He appears well-developed and well-nourished. No distress.  Cardiovascular: Normal rate, regular rhythm and normal heart sounds.  Exam reveals no gallop and no friction rub.   No murmur heard. Pulmonary/Chest: Effort normal and breath sounds normal. No respiratory distress. He has no wheezes.          Assessment & Plan:

## 2013-01-06 NOTE — Assessment & Plan Note (Signed)
I did discuss with him at length the benefits of treatment and he possible side effects. I did warn him of active virus and problems with organs including kidney and heart as well as other issues. He did voice his understanding. He is going to call back for an appointment to further discuss and consider starting therapy. He tells me he wants to "process the information" first and he will call back today or soon to schedule another appointment. I will therefore leave his return appointment up to him to

## 2013-10-22 ENCOUNTER — Telehealth: Payer: Self-pay | Admitting: *Deleted

## 2013-10-22 NOTE — Telephone Encounter (Signed)
Phone not working

## 2013-11-20 ENCOUNTER — Ambulatory Visit (INDEPENDENT_AMBULATORY_CARE_PROVIDER_SITE_OTHER): Payer: BC Managed Care – PPO | Admitting: Family Medicine

## 2013-11-20 VITALS — BP 150/84 | HR 88 | Temp 98.4°F | Resp 16 | Ht 70.5 in | Wt 189.2 lb

## 2013-11-20 DIAGNOSIS — R319 Hematuria, unspecified: Secondary | ICD-10-CM

## 2013-11-20 DIAGNOSIS — M549 Dorsalgia, unspecified: Secondary | ICD-10-CM

## 2013-11-20 LAB — POCT UA - MICROSCOPIC ONLY
Crystals, Ur, HPF, POC: NEGATIVE
Epithelial cells, urine per micros: NEGATIVE
Yeast, UA: NEGATIVE

## 2013-11-20 LAB — POCT URINALYSIS DIPSTICK
Bilirubin, UA: NEGATIVE
Leukocytes, UA: NEGATIVE
Nitrite, UA: NEGATIVE
Protein, UA: NEGATIVE
pH, UA: 6

## 2013-11-20 MED ORDER — TAMSULOSIN HCL 0.4 MG PO CAPS: 0.4000 mg | ORAL_CAPSULE | Freq: Every day | ORAL | Status: DC

## 2013-11-20 MED ORDER — OXYCODONE-ACETAMINOPHEN 5-325 MG PO TABS: 1.0000 | ORAL_TABLET | Freq: Three times a day (TID) | ORAL | Status: DC | PRN

## 2013-11-20 NOTE — Patient Instructions (Addendum)
Take the flomax once a day for the next week or so, and use the pain medication as needed.  Let me know if your are not feeling better in the next couple of days.  Strain your urine and push fluids.  Use heat on your back muscles and move around frequently.    If you cannot pass urine or have uncontrolled pain please let us know.

## 2013-11-20 NOTE — Progress Notes (Signed)
[facility] [address] [place] [address] [phone]  Date:  11/20/2013   Name:  Bradley Mullen   DOB:  10-25-1986   MRN:  [id number]  PCP:  No primary provider on file.    Chief Complaint: Back Pain   History of Present Illness:  Bradley Mullen is a 27 y.o. very pleasant male patient who presents with the following:  He is here today with left lower back pain.   He has pain if he lifts his left leg and when he stays in one position for too long.   This does NOT seem like when he had a kidney stone in the past.   This started when he was walking down a flight of stairs yesterday.  He hit the bottom step and felt onset of pain.   If he lifts the left leg he can get an electrical feeling doewn the leg  He has never had this in the past.   He has not note leg weakness or numbness, no bowel or bladder incontinence.    His HIV care is through [facility].  His numbers have been good and he is not on any tx currently  He has tried ibuprofen which does not help much.  Lying down does help.    He last had a stone in 2008.  He was treated with something for pain and flomax and got better.  He has had just one stone.   Patient Active Problem List   Diagnosis Date Noted  . 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 PERSONAL HISTORY OF COLONIC POLYPS 11/14/2010  . HIV INFECTION 10/31/2010    No past medical history on file.  No past surgical history on file.  History  Substance Use Topics  . Smoking status: Never Smoker   . Smokeless tobacco: Never Used  . Alcohol Use: No    No family history on file.  No Known Allergies  Medication list has been reviewed and updated.  No current outpatient prescriptions on file prior to visit.   No current facility-administered medications on file prior to visit.    Review of Systems:  As per HPI- otherwise negative.   Physical Examination: Filed Vitals:   11/20/13 1649  BP: 150/84  Pulse: 88  Temp:  98.4 F (36.9 C)  Resp: 16   Filed Vitals:   11/20/13 1649  Height: 5' 10.5" (1.791 m)  Weight: 189 lb 3.2 oz (85.821 kg)   Body mass index is 26.75 kg/(m^2). Ideal Body Weight: Weight in (lb) to have BMI = 25: 176.4  GEN: WDWN, NAD, Non-toxic, A & O x 3, looks well HEENT: Atraumatic, Normocephalic. Neck supple. No masses, No LAD. Ears and Nose: No external deformity. CV: RRR, No M/G/R. No JVD. No thrill. No extra heart sounds. PULM: CTA B, no wheezes, crackles, rhonchi. No retractions. No resp. distress. No accessory muscle use. ABD: S, NT, ND, +BS. No rebound. No HSM. EXTR: No c/c/e NEURO Normal gait.  PSYCH: Normally interactive. Conversant. Not depressed or anxious appearing.  Calm demeanor.  He has tight, slightly tender, spasmed muscles on the right lumbar area.  Good back flexion and extension Normal strength, sensation all extremites.  Negative SLR, no saddle anesthesia, normal knee and ankle DTR.   Results for orders placed in visit on 11/20/13  POCT UA - MICROSCOPIC ONLY      Result Value Range   WBC, Ur, HPF, POC 0-3     RBC, urine,  microscopic 8-17     Bacteria, U Microscopic trace     Mucus, UA large     Epithelial cells, urine per micros neg     Crystals, Ur, HPF, POC neg     Casts, Ur, LPF, POC neg     Yeast, UA neg    POCT URINALYSIS DIPSTICK      Result Value Range   Color, UA yellow     Clarity, UA clear     Glucose, UA neg     Bilirubin, UA neg     Ketones, UA neg     Spec Grav, UA 1.020     Blood, UA mod     pH, UA 6.0     Protein, UA neg     Urobilinogen, UA 0.2     Nitrite, UA neg     Leukocytes, UA Negative      Assessment and Plan: Back pain - Plan: POCT UA - Microscopic Only, POCT urinalysis dipstick, oxyCODONE-acetaminophen (ROXICET) 5-325 MG per tablet, Urine culture, Basic metabolic panel, tamsulosin (FLOMAX) 0.4 MG CAPS capsule, DISCONTINUED: tamsulosin (FLOMAX) 0.4 MG CAPS capsule  Hematuria  He reports he has been told that he had  hematuria in the past, but is not sure of what w/u he has had.  He could have a kidney stone.  Will treat for this and for muscle pain with flomax and percocet 5mg .  Urine strainer.   Will plan further follow- up pending labs. See patient instructions for more details.      Signed Abbe Amsterdam, MD

## 2013-11-21 LAB — BASIC METABOLIC PANEL
BUN: 10 mg/dL (ref 6–23)
Calcium: 9.5 mg/dL (ref 8.4–10.5)
Glucose, Bld: 91 mg/dL (ref 70–99)

## 2013-11-22 ENCOUNTER — Encounter: Payer: Self-pay | Admitting: Family Medicine

## 2013-12-22 ENCOUNTER — Telehealth: Payer: Self-pay | Admitting: *Deleted

## 2013-12-22 NOTE — Telephone Encounter (Signed)
Left message on voicemail asking patient to please call and make an appointment. Andree CossHowell, Emina Ribaudo M, RN

## 2013-12-22 NOTE — Telephone Encounter (Signed)
Message copied by Andree CossHOWELL, Sherri Mcarthy M on Tue Dec 22, 2013 10:14 AM ------      Message from: Gardiner BarefootOMER, ROBERT W      Created: Mon Dec 21, 2013 12:33 PM       He has not been seen in a year and needs follow up. thanks ------

## 2014-03-24 ENCOUNTER — Other Ambulatory Visit: Payer: Self-pay | Admitting: *Deleted

## 2014-03-24 DIAGNOSIS — B20 Human immunodeficiency virus [HIV] disease: Secondary | ICD-10-CM

## 2014-03-26 ENCOUNTER — Other Ambulatory Visit: Payer: BC Managed Care – PPO

## 2014-03-26 DIAGNOSIS — B20 Human immunodeficiency virus [HIV] disease: Secondary | ICD-10-CM

## 2014-03-26 LAB — CBC WITH DIFFERENTIAL/PLATELET
BASOS PCT: 1 % (ref 0–1)
Basophils Absolute: 0 10*3/uL (ref 0.0–0.1)
Eosinophils Absolute: 0.1 10*3/uL (ref 0.0–0.7)
Eosinophils Relative: 2 % (ref 0–5)
HEMATOCRIT: 38.6 % — AB (ref 39.0–52.0)
Hemoglobin: 13 g/dL (ref 13.0–17.0)
Lymphocytes Relative: 55 % — ABNORMAL HIGH (ref 12–46)
Lymphs Abs: 2.4 10*3/uL (ref 0.7–4.0)
MCH: 26.9 pg (ref 26.0–34.0)
MCHC: 33.7 g/dL (ref 30.0–36.0)
MCV: 79.9 fL (ref 78.0–100.0)
MONO ABS: 0.3 10*3/uL (ref 0.1–1.0)
Monocytes Relative: 7 % (ref 3–12)
NEUTROS PCT: 35 % — AB (ref 43–77)
Neutro Abs: 1.5 10*3/uL — ABNORMAL LOW (ref 1.7–7.7)
Platelets: 218 10*3/uL (ref 150–400)
RBC: 4.83 MIL/uL (ref 4.22–5.81)
RDW: 14.6 % (ref 11.5–15.5)
WBC: 4.3 10*3/uL (ref 4.0–10.5)

## 2014-03-26 LAB — COMPLETE METABOLIC PANEL WITH GFR
ALK PHOS: 37 U/L — AB (ref 39–117)
ALT: 18 U/L (ref 0–53)
AST: 19 U/L (ref 0–37)
Albumin: 4.1 g/dL (ref 3.5–5.2)
BILIRUBIN TOTAL: 0.4 mg/dL (ref 0.2–1.2)
BUN: 13 mg/dL (ref 6–23)
CO2: 27 mEq/L (ref 19–32)
Calcium: 9.3 mg/dL (ref 8.4–10.5)
Chloride: 102 mEq/L (ref 96–112)
Creat: 1.02 mg/dL (ref 0.50–1.35)
GFR, Est African American: >89 mL/min
GFR, Est Non African American: >89 mL/min
Glucose, Bld: 92 mg/dL (ref 70–99)
Potassium: 4.4 mEq/L (ref 3.5–5.3)
Sodium: 138 mEq/L (ref 135–145)
Total Protein: 7.9 g/dL (ref 6.0–8.3)

## 2014-03-26 LAB — LIPID PANEL
Cholesterol: 141 mg/dL (ref 0–200)
HDL: 37 mg/dL — AB (ref >39)
LDL CALC: 97 mg/dL (ref 0–99)
TRIGLYCERIDES: 37 mg/dL (ref <150)
Total CHOL/HDL Ratio: 3.8 Ratio
VLDL: 7 mg/dL (ref 0–40)

## 2014-03-26 LAB — T-HELPER CELL (CD4) - (RCID CLINIC ONLY)
CD4 T CELL HELPER: 19 % — AB (ref 33–55)
CD4 T Cell Abs: 500 /uL (ref 400–2700)

## 2014-03-26 LAB — RPR

## 2014-03-29 LAB — URINE CYTOLOGY ANCILLARY ONLY
Chlamydia: NEGATIVE
NEISSERIA GONORRHEA: NEGATIVE

## 2014-03-29 LAB — HIV-1 RNA QUANT-NO REFLEX-BLD
HIV 1 RNA Quant: 17238 copies/mL — ABNORMAL HIGH (ref <20)
HIV-1 RNA Quant, Log: 4.24 {Log} — ABNORMAL HIGH (ref <1.30)

## 2014-06-03 ENCOUNTER — Encounter: Payer: Self-pay | Admitting: Internal Medicine

## 2014-06-03 ENCOUNTER — Ambulatory Visit (INDEPENDENT_AMBULATORY_CARE_PROVIDER_SITE_OTHER): Payer: BC Managed Care – PPO | Admitting: Internal Medicine

## 2014-06-03 VITALS — BP 156/97 | Temp 98.2°F | Wt 182.0 lb

## 2014-06-03 DIAGNOSIS — B20 Human immunodeficiency virus [HIV] disease: Secondary | ICD-10-CM

## 2014-06-03 MED ORDER — ELVITEG-COBIC-EMTRICIT-TENOFDF 150-150-200-300 MG PO TABS: 1.0000 | ORAL_TABLET | Freq: Every day | ORAL | Status: DC

## 2014-06-03 NOTE — Progress Notes (Signed)
Subjective:    Patient ID: Bradley Mullen, male    DOB: 01-Nov-1986, 28 y.o.   MRN: [id number]  HPI 29yo M with HIV, CD 4 count of 500/ VL 17, 238, genotype in 2011 is wildtype. He is coming in for his yearly visit. He is never been on  HAART. Looking forward to starting medications. In good state of health. No recent illness  Current Outpatient Prescriptions on File Prior to Visit  Medication Sig Dispense Refill  . oxyCODONE-acetaminophen (ROXICET) 5-325 MG per tablet Take 1 tablet by mouth every 8 (eight) hours as needed for severe pain.  20 tablet  0  . tamsulosin (FLOMAX) 0.4 MG CAPS capsule Take 1 capsule (0.4 mg total) by mouth daily.  30 capsule  3   No current facility-administered medications on file prior to visit.   Active Ambulatory Problems    Diagnosis Date Noted  . HIV INFECTION 10/31/2010  . 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 PERSONAL HISTORY OF COLONIC POLYPS 11/14/2010   Resolved Ambulatory Problems    Diagnosis Date Noted  . No Resolved Ambulatory Problems   No Additional Past Medical History   History  Substance Use Topics  . Smoking status: Never Smoker   . Smokeless tobacco: Never Used  . Alcohol Use: No  family history is not on file.   Review of Systems Review of Systems  Constitutional: Negative for fever, chills, diaphoresis, activity change, appetite change, fatigue and unexpected weight change.  HENT: Negative for congestion, sore throat, rhinorrhea, sneezing, trouble swallowing and sinus pressure.  Eyes: Negative for photophobia and visual disturbance.  Respiratory: Negative for cough, chest tightness, shortness of breath, wheezing and stridor.  Cardiovascular: Negative for chest pain, palpitations and leg swelling.  Gastrointestinal: Negative for nausea, vomiting, abdominal pain, diarrhea, constipation, blood in stool, abdominal distention and anal bleeding.  Genitourinary: Negative for dysuria, hematuria, flank pain and difficulty  urinating.  Musculoskeletal: Negative for myalgias, back pain, joint swelling, arthralgias and gait problem.  Skin: Negative for color change, pallor, rash and wound.  Neurological: Negative for dizziness, tremors, weakness and light-headedness.  Hematological: Negative for adenopathy. Does not bruise/bleed easily.  Psychiatric/Behavioral: Negative for behavioral problems, confusion, sleep disturbance, dysphoric mood, decreased concentration and agitation.       Objective:   Physical Exam BP 156/97  Temp(Src) 98.2 F (36.8 C) (Oral)  Wt 182 lb (82.555 kg) Constitutional: He is oriented to person, place, and time. He appears well-developed and well-nourished. No distress.  HENT:  Mouth/Throat: Oropharynx is clear and moist. No oropharyngeal exudate.  Cardiovascular: Normal rate, regular rhythm and normal heart sounds. Exam reveals no gallop and no friction rub.  No murmur heard.  Pulmonary/Chest: Effort normal and breath sounds normal. No respiratory distress. He has no wheezes.  Abdominal: Soft. Bowel sounds are normal. He exhibits no distension. There is no tenderness.  Lymphadenopathy:  He has no cervical adenopathy.  Neurological: He is alert and oriented to person, place, and time.  Skin: Skin is warm and dry. No rash noted. No erythema.  Psychiatric: He has a normal mood and affect. His behavior is normal.     Labs: CBC    Component Value Date/Time   WBC 4.3 03/26/2014 1031   RBC 4.83 03/26/2014 1031   HGB 13.0 03/26/2014 1031   HCT 38.6* 03/26/2014 1031   PLT 218 03/26/2014 1031   MCV 79.9 03/26/2014 1031   MCH 26.9 03/26/2014 1031   MCHC 33.7 03/26/2014 1031   RDW 14.6 03/26/2014 1031  LYMPHSABS 2.4 03/26/2014 1031   MONOABS 0.3 03/26/2014 1031   EOSABS 0.1 03/26/2014 1031   BASOSABS 0.0 03/26/2014 1031    BMET    Component Value Date/Time   NA 138 03/26/2014 1031   K 4.4 03/26/2014 1031   CL 102 03/26/2014 1031   CO2 27 03/26/2014 1031   GLUCOSE 92 03/26/2014 1031    BUN 13 03/26/2014 1031   CREATININE 1.02 03/26/2014 1031   CREATININE 1.07 02/14/2011 1824   CALCIUM 9.3 03/26/2014 1031   GFRNONAA >89 03/26/2014 1031   GFRNONAA >60 03/30/2008 0120   GFRAA >89 03/26/2014 1031   GFRAA  Value: >60        The eGFR has been calculated using the MDRD equation. This calculation has not been validated in all clinical 03/30/2008 0120    NON REAC (04/10 1031)      Assessment & Plan:  hiv = uncontrolled,  Will start stribild. Did adherence counseling. Gave co pay card  Health maintenance = will review vaccination and start needed series at next visit  rtc in 4 wk 

## 2014-06-09 ENCOUNTER — Other Ambulatory Visit: Payer: Self-pay | Admitting: *Deleted

## 2014-06-09 DIAGNOSIS — B20 Human immunodeficiency virus [HIV] disease: Secondary | ICD-10-CM

## 2014-06-09 MED ORDER — ELVITEG-COBIC-EMTRICIT-TENOFDF 150-150-200-300 MG PO TABS: 1.0000 | ORAL_TABLET | Freq: Every day | ORAL | Status: DC

## 2014-06-10 ENCOUNTER — Other Ambulatory Visit: Payer: Self-pay | Admitting: Licensed Clinical Social Worker

## 2014-06-10 DIAGNOSIS — B20 Human immunodeficiency virus [HIV] disease: Secondary | ICD-10-CM

## 2014-06-10 MED ORDER — ELVITEG-COBIC-EMTRICIT-TENOFDF 150-150-200-300 MG PO TABS: 1.0000 | ORAL_TABLET | Freq: Every day | ORAL | Status: DC

## 2014-07-07 ENCOUNTER — Ambulatory Visit: Payer: BC Managed Care – PPO | Admitting: Internal Medicine

## 2014-07-07 ENCOUNTER — Telehealth: Payer: Self-pay | Admitting: *Deleted

## 2014-07-07 DIAGNOSIS — B20 Human immunodeficiency virus [HIV] disease: Secondary | ICD-10-CM

## 2014-07-07 NOTE — Telephone Encounter (Signed)
Pt no-showed today's appointment due to a delay starting his new medication. Patient only started Stribild 1 week ago on 7/13.  RN rescheduled his labs, follow up with Dr. Drue SecondSnider.  Lab orders placed. Andree CossHowell, Senai Ramnath M, RN

## 2014-07-12 ENCOUNTER — Telehealth: Payer: Self-pay | Admitting: *Deleted

## 2014-07-12 NOTE — Telephone Encounter (Signed)
Patient notified and he will call us if the rash worsens.

## 2014-07-12 NOTE — Telephone Encounter (Signed)
Can you call him back to see if his rash has progressed? If it continues to involve more of torso or arms and legs to let us know. Would like to wait and see what happens. For the rash on neck and collarbone, make sure he uses bland soap and bland detergent

## 2014-07-12 NOTE — Telephone Encounter (Signed)
Patient called c/o itchy rash on neck and collarbone 3 days after starting Stribild on 06/25/14. Dr. Drue SecondSnider told him to call if he developed a rash. He has continued to take the medication and his follow up appointment is 08/10/24. Please advise

## 2014-07-27 ENCOUNTER — Other Ambulatory Visit: Payer: BC Managed Care – PPO

## 2014-07-27 DIAGNOSIS — B20 Human immunodeficiency virus [HIV] disease: Secondary | ICD-10-CM

## 2014-07-28 LAB — HIV-1 RNA QUANT-NO REFLEX-BLD
HIV 1 RNA QUANT: 29 {copies}/mL — AB (ref <20)
HIV-1 RNA QUANT, LOG: 1.46 {Log} — AB (ref <1.30)

## 2014-07-28 LAB — T-HELPER CELL (CD4) - (RCID CLINIC ONLY)
CD4 T CELL HELPER: 21 % — AB (ref 33–55)
CD4 T Cell Abs: 640 /uL (ref 400–2700)

## 2014-08-10 ENCOUNTER — Ambulatory Visit (INDEPENDENT_AMBULATORY_CARE_PROVIDER_SITE_OTHER): Payer: BC Managed Care – PPO | Admitting: Internal Medicine

## 2014-08-10 ENCOUNTER — Encounter: Payer: Self-pay | Admitting: Internal Medicine

## 2014-08-10 VITALS — BP 157/79 | HR 83 | Temp 98.2°F | Wt 179.0 lb

## 2014-08-10 DIAGNOSIS — B20 Human immunodeficiency virus [HIV] disease: Secondary | ICD-10-CM

## 2014-08-10 NOTE — Progress Notes (Signed)
Patient ID: Bradley Mullen, male   DOB: 03/15/1986, 28 y.o.   MRN: [id number]       Patient ID: Bradley Mullen, male   DOB: 08/11/1986, 28 y.o.   MRN: [id number]  HPI 28 yo M with HIV, CD 4 count of 640/VL 28 after 1 month of being on stribild. He initially had a rash to his neck when he started taking meds but then it resolved on its own. He reports having one day of flu like symptoms last week, but now back to his normal baseline.  Outpatient Encounter Prescriptions as of 08/10/2014  Medication Sig  . elvitegravir-cobicistat-emtricitabine-tenofovir (STRIBILD) 150-150-200-300 MG TABS tablet Take 1 tablet by mouth daily with breakfast.  . oxyCODONE-acetaminophen (ROXICET) 5-325 MG per tablet Take 1 tablet by mouth every 8 (eight) hours as needed for severe pain.  . tamsulosin (FLOMAX) 0.4 MG CAPS capsule Take 1 capsule (0.4 mg total) by mouth daily.     Patient Active Problem List   Diagnosis Date Noted  . 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 PERSONAL HISTORY OF COLONIC POLYPS 11/14/2010  . HIV INFECTION 10/31/2010     Health Maintenance Due  Topic Date Due  . Influenza Vaccine  07/17/2014     Review of Systems Review of Systems  Constitutional: Negative for fever, chills, diaphoresis, activity change, appetite change, fatigue and unexpected weight change.  HENT: Negative for congestion, sore throat, rhinorrhea, sneezing, trouble swallowing and sinus pressure.  Eyes: Negative for photophobia and visual disturbance.  Respiratory: Negative for cough, chest tightness, shortness of breath, wheezing and stridor.  Cardiovascular: Negative for chest pain, palpitations and leg swelling.  Gastrointestinal: Negative for nausea, vomiting, abdominal pain, diarrhea, constipation, blood in stool, abdominal distention and anal bleeding.  Genitourinary: Negative for dysuria, hematuria, flank pain and difficulty urinating.  Musculoskeletal: Negative for myalgias, back pain, joint swelling,  arthralgias and gait problem.  Skin: Negative for color change, pallor, rash and wound.  Neurological: Negative for dizziness, tremors, weakness and light-headedness.  Hematological: Negative for adenopathy. Does not bruise/bleed easily.  Psychiatric/Behavioral: Negative for behavioral problems, confusion, sleep disturbance, dysphoric mood, decreased concentration and agitation.    Physical Exam   BP 157/79  Pulse 83  Temp(Src) 98.2 F (36.8 C) (Oral)  Wt 179 lb (81.194 kg) Physical Exam  Constitutional: He is oriented to person, place, and time. He appears well-developed and well-nourished. No distress.  HENT:  Mouth/Throat: Oropharynx is clear and moist. No oropharyngeal exudate.  Cardiovascular: Normal rate, regular rhythm and normal heart sounds. Exam reveals no gallop and no friction rub.  No murmur heard.  Pulmonary/Chest: Effort normal and breath sounds normal. No respiratory distress. He has no wheezes.  Lymphadenopathy:  He has no cervical adenopathy.  Neurological: He is alert and oriented to person, place, and time.  Skin: Skin is warm and dry. No rash noted. No erythema.  Psychiatric: He has a normal mood and affect. His behavior is normal.    Lab Results  Component Value Date   CD4TCELL 21* 07/27/2014   Lab Results  Component Value Date   CD4TABS 640 07/27/2014   CD4TABS 500 03/26/2014   CD4TABS 640 12/22/2012   Lab Results  Component Value Date   HIV1RNAQUANT 29* 07/27/2014   Lab Results  Component Value Date   HEPBSAB POS* 10/31/2010   No results found for this basename: RPR    CBC Lab Results  Component Value Date   WBC 4.3 03/26/2014   RBC 4.83 03/26/2014   HGB 13.0 03/26/2014  HCT 38.6* 03/26/2014   PLT 218 03/26/2014   MCV 79.9 03/26/2014   MCH 26.9 03/26/2014   MCHC 33.7 03/26/2014   RDW 14.6 03/26/2014   LYMPHSABS 2.4 03/26/2014   MONOABS 0.3 03/26/2014   EOSABS 0.1 03/26/2014   BASOSABS 0.0 03/26/2014   BMET Lab Results  Component Value Date    NA 138 03/26/2014   K 4.4 03/26/2014   CL 102 03/26/2014   CO2 27 03/26/2014   GLUCOSE 92 03/26/2014   BUN 13 03/26/2014   CREATININE 1.02 03/26/2014   CALCIUM 9.3 03/26/2014   GFRNONAA >89 03/26/2014   GFRAA >89 03/26/2014     Assessment and Plan   hiv = well controlled only after 1 month of meds. Continue on stribild   Doing excellent on his adherence  Health maintenance = deferred getting flu vaccine at this visit.   rtc in 2 months

## 2014-08-26 ENCOUNTER — Other Ambulatory Visit: Payer: Self-pay | Admitting: *Deleted

## 2014-08-26 DIAGNOSIS — B20 Human immunodeficiency virus [HIV] disease: Secondary | ICD-10-CM

## 2014-08-26 MED ORDER — ELVITEG-COBIC-EMTRICIT-TENOFDF 150-150-200-300 MG PO TABS: 1.0000 | ORAL_TABLET | Freq: Every day | ORAL | Status: DC

## 2014-08-30 ENCOUNTER — Ambulatory Visit: Payer: Self-pay

## 2014-08-31 ENCOUNTER — Other Ambulatory Visit: Payer: Self-pay | Admitting: *Deleted

## 2014-08-31 ENCOUNTER — Telehealth: Payer: Self-pay | Admitting: *Deleted

## 2014-08-31 DIAGNOSIS — B20 Human immunodeficiency virus [HIV] disease: Secondary | ICD-10-CM

## 2014-08-31 MED ORDER — ELVITEG-COBIC-EMTRICIT-TENOFDF 150-150-200-300 MG PO TABS: 1.0000 | ORAL_TABLET | Freq: Every day | ORAL | Status: DC

## 2014-08-31 NOTE — Telephone Encounter (Signed)
Needs to make 2 mo f/u appt with Dr. Drue Second after starting Stribild.  Will need labs prior.

## 2014-08-31 NOTE — Telephone Encounter (Signed)
ADAP Application 

## 2014-09-29 ENCOUNTER — Other Ambulatory Visit: Payer: Self-pay

## 2014-09-30 ENCOUNTER — Other Ambulatory Visit: Payer: Self-pay

## 2014-12-15 ENCOUNTER — Other Ambulatory Visit (INDEPENDENT_AMBULATORY_CARE_PROVIDER_SITE_OTHER): Payer: Self-pay

## 2014-12-15 DIAGNOSIS — B20 Human immunodeficiency virus [HIV] disease: Secondary | ICD-10-CM

## 2014-12-15 LAB — COMPLETE METABOLIC PANEL WITH GFR
ALT: 19 U/L (ref 0–53)
AST: 18 U/L (ref 0–37)
Albumin: 4.3 g/dL (ref 3.5–5.2)
Alkaline Phosphatase: 44 U/L (ref 39–117)
BUN: 13 mg/dL (ref 6–23)
CO2: 30 meq/L (ref 19–32)
Calcium: 9.5 mg/dL (ref 8.4–10.5)
Chloride: 103 mEq/L (ref 96–112)
Creat: 1.24 mg/dL (ref 0.50–1.35)
GFR, Est African American: >89 mL/min
GFR, Est Non African American: 78 mL/min
Glucose, Bld: 88 mg/dL (ref 70–99)
Potassium: 5.3 mEq/L (ref 3.5–5.3)
Sodium: 139 mEq/L (ref 135–145)
Total Bilirubin: 0.3 mg/dL (ref 0.2–1.2)
Total Protein: 7.6 g/dL (ref 6.0–8.3)

## 2014-12-15 LAB — CBC WITH DIFFERENTIAL/PLATELET
Basophils Absolute: 0.1 10*3/uL (ref 0.0–0.1)
Basophils Relative: 1 % (ref 0–1)
EOS ABS: 0.2 10*3/uL (ref 0.0–0.7)
EOS PCT: 3 % (ref 0–5)
HCT: 41.1 % (ref 39.0–52.0)
Hemoglobin: 13.7 g/dL (ref 13.0–17.0)
LYMPHS PCT: 55 % — AB (ref 12–46)
Lymphs Abs: 2.9 10*3/uL (ref 0.7–4.0)
MCH: 28.5 pg (ref 26.0–34.0)
MCHC: 33.3 g/dL (ref 30.0–36.0)
MCV: 85.6 fL (ref 78.0–100.0)
MONO ABS: 0.3 10*3/uL (ref 0.1–1.0)
MPV: 10 fL (ref 8.6–12.4)
Monocytes Relative: 6 % (ref 3–12)
Neutro Abs: 1.9 10*3/uL (ref 1.7–7.7)
Neutrophils Relative %: 35 % — ABNORMAL LOW (ref 43–77)
PLATELETS: 237 10*3/uL (ref 150–400)
RBC: 4.8 MIL/uL (ref 4.22–5.81)
RDW: 14.4 % (ref 11.5–15.5)
WBC: 5.3 10*3/uL (ref 4.0–10.5)

## 2014-12-16 LAB — T-HELPER CELL (CD4) - (RCID CLINIC ONLY)
CD4 % Helper T Cell: 25 % — ABNORMAL LOW (ref 33–55)
CD4 T CELL ABS: 690 /uL (ref 400–2700)

## 2014-12-18 LAB — HIV-1 RNA QUANT-NO REFLEX-BLD: HIV-1 RNA Quant, Log: <1.3 {Log} (ref <1.30)

## 2015-02-08 ENCOUNTER — Telehealth: Payer: Self-pay | Admitting: Licensed Clinical Social Worker

## 2015-02-08 NOTE — Telephone Encounter (Signed)
Patient called stating that he is having consistent diarrhea since starting Stribild. Patient states that it is worsening, with cramps and discomfort mainly in the morning after taking it. He has and appointment on 3/3 but would like to either stop until then or start something else. Please advise

## 2015-02-09 ENCOUNTER — Ambulatory Visit: Payer: Self-pay

## 2015-02-11 NOTE — Telephone Encounter (Signed)
He can probably take some imodium. If it is too severe, can stop HAART and we will see him next week

## 2015-02-14 ENCOUNTER — Other Ambulatory Visit: Payer: Self-pay | Admitting: *Deleted

## 2015-02-14 DIAGNOSIS — B20 Human immunodeficiency virus [HIV] disease: Secondary | ICD-10-CM

## 2015-02-14 MED ORDER — ELVITEG-COBIC-EMTRICIT-TENOFDF 150-150-200-300 MG PO TABS: 1.0000 | ORAL_TABLET | Freq: Every day | ORAL | Status: DC

## 2015-02-14 NOTE — Telephone Encounter (Signed)
ADAP Application 

## 2015-02-15 NOTE — Telephone Encounter (Signed)
Patient returned call and was advised to try Immodium. He advised he has and it did not work but he has an appt 02/17/15 and he will not take any more medication until he sees the doctor as he still feels bad.

## 2015-02-15 NOTE — Telephone Encounter (Signed)
Left patient a message to call back.

## 2015-02-17 ENCOUNTER — Encounter: Payer: Self-pay | Admitting: Internal Medicine

## 2015-02-17 ENCOUNTER — Ambulatory Visit (INDEPENDENT_AMBULATORY_CARE_PROVIDER_SITE_OTHER): Payer: Self-pay | Admitting: Internal Medicine

## 2015-02-17 VITALS — BP 136/87 | HR 78 | Temp 97.2°F | Wt 169.0 lb

## 2015-02-17 DIAGNOSIS — B20 Human immunodeficiency virus [HIV] disease: Secondary | ICD-10-CM

## 2015-02-17 DIAGNOSIS — Z113 Encounter for screening for infections with a predominantly sexual mode of transmission: Secondary | ICD-10-CM

## 2015-02-17 DIAGNOSIS — K589 Irritable bowel syndrome without diarrhea: Secondary | ICD-10-CM

## 2015-02-17 LAB — CBC WITH DIFFERENTIAL/PLATELET
BASOS ABS: 0 10*3/uL (ref 0.0–0.1)
Basophils Relative: 1 % (ref 0–1)
EOS ABS: 0 10*3/uL (ref 0.0–0.7)
Eosinophils Relative: 1 % (ref 0–5)
HCT: 40.7 % (ref 39.0–52.0)
HEMOGLOBIN: 13.4 g/dL (ref 13.0–17.0)
LYMPHS ABS: 1.8 10*3/uL (ref 0.7–4.0)
Lymphocytes Relative: 46 % (ref 12–46)
MCH: 28.3 pg (ref 26.0–34.0)
MCHC: 32.9 g/dL (ref 30.0–36.0)
MCV: 86 fL (ref 78.0–100.0)
MPV: 9.7 fL (ref 8.6–12.4)
Monocytes Absolute: 0.2 10*3/uL (ref 0.1–1.0)
Monocytes Relative: 6 % (ref 3–12)
NEUTROS PCT: 46 % (ref 43–77)
Neutro Abs: 1.8 10*3/uL (ref 1.7–7.7)
PLATELETS: 279 10*3/uL (ref 150–400)
RBC: 4.73 MIL/uL (ref 4.22–5.81)
RDW: 14.9 % (ref 11.5–15.5)
WBC: 4 10*3/uL (ref 4.0–10.5)

## 2015-02-17 LAB — COMPLETE METABOLIC PANEL WITH GFR
ALBUMIN: 4.3 g/dL (ref 3.5–5.2)
ALT: 18 U/L (ref 0–53)
AST: 20 U/L (ref 0–37)
Alkaline Phosphatase: 44 U/L (ref 39–117)
BUN: 9 mg/dL (ref 6–23)
CALCIUM: 10.3 mg/dL (ref 8.4–10.5)
CO2: 28 mEq/L (ref 19–32)
Chloride: 104 mEq/L (ref 96–112)
Creat: 1.1 mg/dL (ref 0.50–1.35)
GFR, Est African American: >89 mL/min
GFR, Est Non African American: >89 mL/min
GLUCOSE: 90 mg/dL (ref 70–99)
Potassium: 4.3 mEq/L (ref 3.5–5.3)
Sodium: 140 mEq/L (ref 135–145)
Total Bilirubin: 0.6 mg/dL (ref 0.2–1.2)
Total Protein: 7.5 g/dL (ref 6.0–8.3)

## 2015-02-17 MED ORDER — DICYCLOMINE HCL 10 MG PO CAPS: 10.0000 mg | ORAL_CAPSULE | Freq: Three times a day (TID) | ORAL | Status: DC

## 2015-02-17 MED ORDER — DICYCLOMINE HCL 20 MG PO TABS: 20.0000 mg | ORAL_TABLET | Freq: Three times a day (TID) | ORAL | Status: DC

## 2015-02-17 NOTE — Progress Notes (Signed)
Patient ID: Bradley Mullen, male   DOB: 1986-01-02, 29 y.o.   MRN: [id number]       Patient ID: Bradley Mullen, male   DOB: Dec 25, 1985, 29 y.o.   MRN: [id number]  HPI 29yo M with HIV disease, well controlled with stribild, recent labs show 690/VL<20 (dec 2016). He is having significant GI side effects with stribild, and 10 lb weight loss. He reports that over the last 3 months, he has been having loose stools and abdominal cramping after taking stribild, but also occurs with food in general. He suffers from diarrhea, abdominal bloating/gas, and cramping. Not necessarily reflux. He has been taking peptobismal, occ. Immodium. Recently started on probiotics in the last week, and having some improvement, only having 1 loose stool per day. He is not taking his stribild with food.   He has switched his dietary intake to being vegetarian based. He has significant bloating with kale, which he enjoys.   Still refusing flu shot  Outpatient Encounter Prescriptions as of 02/17/2015  Medication Sig  . elvitegravir-cobicistat-emtricitabine-tenofovir (STRIBILD) 150-150-200-300 MG TABS tablet Take 1 tablet by mouth daily with breakfast.  . oxyCODONE-acetaminophen (ROXICET) 5-325 MG per tablet Take 1 tablet by mouth every 8 (eight) hours as needed for severe pain. (Patient not taking: Reported on 02/17/2015)  . tamsulosin (FLOMAX) 0.4 MG CAPS capsule Take 1 capsule (0.4 mg total) by mouth daily. (Patient not taking: Reported on 02/17/2015)     Patient Active Problem List   Diagnosis Date Noted  . 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 PERSONAL HISTORY OF COLONIC POLYPS 11/14/2010  . HIV INFECTION 10/31/2010     Health Maintenance Due  Topic Date Due  . INFLUENZA VACCINE  07/17/2014     Review of Systems  Physical Exam   BP 136/87 mmHg  Pulse 78  Temp(Src) 97.2 F (36.2 C) (Oral)  Wt 169 lb (76.658 kg)  Lab Results  Component Value Date   CD4TCELL 25* 12/15/2014   Lab Results  Component Value  Date   CD4TABS 690 12/15/2014   CD4TABS 640 07/27/2014   CD4TABS 500 03/26/2014   Lab Results  Component Value Date   HIV1RNAQUANT <20 12/15/2014   Lab Results  Component Value Date   HEPBSAB POS* 10/31/2010   No results found for: RPR  CBC Lab Results  Component Value Date   WBC 5.3 12/15/2014   RBC 4.80 12/15/2014   HGB 13.7 12/15/2014   HCT 41.1 12/15/2014   PLT 237 12/15/2014   MCV 85.6 12/15/2014   MCH 28.5 12/15/2014   MCHC 33.3 12/15/2014   RDW 14.4 12/15/2014   LYMPHSABS 2.9 12/15/2014   MONOABS 0.3 12/15/2014   EOSABS 0.2 12/15/2014   BASOSABS 0.1 12/15/2014   BMET Lab Results  Component Value Date   NA 139 12/15/2014   K 5.3 12/15/2014   CL 103 12/15/2014   CO2 30 12/15/2014   GLUCOSE 88 12/15/2014   BUN 13 12/15/2014   CREATININE 1.24 12/15/2014   CALCIUM 9.5 12/15/2014   GFRNONAA 78 12/15/2014   GFRAA >89 12/15/2014   CMP    Chemistry      Component Value Date/Time   NA 140 02/17/2015 1035   K 4.3 02/17/2015 1035   CL 104 02/17/2015 1035   CO2 28 02/17/2015 1035   BUN 9 02/17/2015 1035   CREATININE 1.10 02/17/2015 1035   CREATININE 1.07 02/14/2011 1824      Component Value Date/Time   CALCIUM 10.3 02/17/2015 1035   ALKPHOS 44 02/17/2015  1035   AST 20 02/17/2015 1035   ALT 18 02/17/2015 1035   BILITOT 0.6 02/17/2015 1035       Assessment and Plan  hiv disease = well controlled, but he attributes his gi side effects to stribild, which i am not completely convinced it is all drug related, but possibly due to IBS. Will check rpr, hla [id number], cbc, cmp. Continue on stribild, once hla [id number] returns, will change to triomeq  abd discomfort, diarrhea, bloating = will do a trial of bentyl to see if it improves his symptoms. Have him continue prn immodium. And gave hand out of fodmap diet   aki = cr went from 1.0 to 1.24 on stribild. 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 Labs from today reveal that his creatinine is back down to 1.1  Health maintenance = declined  flu vaccine

## 2015-02-18 LAB — RPR

## 2015-02-24 LAB — HLA B*5701: HLA-B 5701 W/RFLX HLA-B HIGH: NEGATIVE

## 2015-03-15 ENCOUNTER — Other Ambulatory Visit: Payer: Self-pay | Admitting: *Deleted

## 2015-03-15 ENCOUNTER — Telehealth: Payer: Self-pay | Admitting: *Deleted

## 2015-03-15 MED ORDER — ABACAVIR-DOLUTEGRAVIR-LAMIVUD 600-50-300 MG PO TABS: 1.0000 | ORAL_TABLET | Freq: Every day | ORAL | Status: DC

## 2015-03-15 NOTE — Telephone Encounter (Signed)
Patient called for results of lab test; specifically the Hlab. Result was negative and Triumeq was sent to his pharmacy per Dr. Storm Frisk office note.

## 2015-03-23 ENCOUNTER — Other Ambulatory Visit: Payer: Self-pay | Admitting: *Deleted

## 2015-03-23 DIAGNOSIS — B20 Human immunodeficiency virus [HIV] disease: Secondary | ICD-10-CM

## 2015-03-23 MED ORDER — ABACAVIR-DOLUTEGRAVIR-LAMIVUD 600-50-300 MG PO TABS
1.0000 | ORAL_TABLET | Freq: Every day | ORAL | Status: DC
Start: 2015-03-23 — End: 2015-11-16

## 2015-05-13 ENCOUNTER — Other Ambulatory Visit: Payer: Self-pay

## 2015-05-20 ENCOUNTER — Emergency Department (HOSPITAL_COMMUNITY): Payer: Self-pay

## 2015-05-20 ENCOUNTER — Emergency Department (HOSPITAL_COMMUNITY)
Admission: EM | Admit: 2015-05-20 | Discharge: 2015-05-21 | Disposition: A | Discharge status: Home / Self Care | Payer: Self-pay | Attending: Emergency Medicine | Admitting: Emergency Medicine

## 2015-05-20 ENCOUNTER — Encounter (HOSPITAL_COMMUNITY): Payer: Self-pay | Admitting: Emergency Medicine

## 2015-05-20 DIAGNOSIS — R51 Headache: Secondary | ICD-10-CM | POA: Insufficient documentation

## 2015-05-20 DIAGNOSIS — R05 Cough: Secondary | ICD-10-CM | POA: Insufficient documentation

## 2015-05-20 DIAGNOSIS — N3090 Cystitis, unspecified without hematuria: Secondary | ICD-10-CM | POA: Insufficient documentation

## 2015-05-20 DIAGNOSIS — Z79899 Other long term (current) drug therapy: Secondary | ICD-10-CM | POA: Insufficient documentation

## 2015-05-20 DIAGNOSIS — R059 Cough, unspecified: Secondary | ICD-10-CM

## 2015-05-20 DIAGNOSIS — R6883 Chills (without fever): Secondary | ICD-10-CM | POA: Insufficient documentation

## 2015-05-20 DIAGNOSIS — Z21 Asymptomatic human immunodeficiency virus [HIV] infection status: Secondary | ICD-10-CM | POA: Insufficient documentation

## 2015-05-20 DIAGNOSIS — R109 Unspecified abdominal pain: Secondary | ICD-10-CM

## 2015-05-20 DIAGNOSIS — J029 Acute pharyngitis, unspecified: Secondary | ICD-10-CM | POA: Insufficient documentation

## 2015-05-20 DIAGNOSIS — M79669 Pain in unspecified lower leg: Secondary | ICD-10-CM | POA: Insufficient documentation

## 2015-05-20 HISTORY — DX: Asymptomatic human immunodeficiency virus (hiv) infection status: Z21

## 2015-05-20 HISTORY — DX: Human immunodeficiency virus (HIV) disease: B20

## 2015-05-20 LAB — URINALYSIS, ROUTINE W REFLEX MICROSCOPIC
BILIRUBIN URINE: NEGATIVE
Glucose, UA: NEGATIVE mg/dL
Ketones, ur: 15 mg/dL — AB
Nitrite: NEGATIVE
Protein, ur: 30 mg/dL — AB
SPECIFIC GRAVITY, URINE: 1.026 (ref 1.005–1.030)
UROBILINOGEN UA: 0.2 mg/dL (ref 0.0–1.0)
pH: 5.5 (ref 5.0–8.0)

## 2015-05-20 LAB — I-STAT CHEM 8, ED
BUN: 22 mg/dL — AB (ref 6–20)
Calcium, Ion: 1.13 mmol/L (ref 1.12–1.23)
Chloride: 101 mmol/L (ref 101–111)
Creatinine, Ser: 1.8 mg/dL — ABNORMAL HIGH (ref 0.61–1.24)
Glucose, Bld: 95 mg/dL (ref 65–99)
HCT: 49 % (ref 39.0–52.0)
Hemoglobin: 16.7 g/dL (ref 13.0–17.0)
POTASSIUM: 3.5 mmol/L (ref 3.5–5.1)
Sodium: 138 mmol/L (ref 135–145)
TCO2: 22 mmol/L (ref 0–100)

## 2015-05-20 LAB — CBC WITH DIFFERENTIAL/PLATELET
BASOS ABS: 0 10*3/uL (ref 0.0–0.1)
Basophils Relative: 0 % (ref 0–1)
Eosinophils Absolute: 0 10*3/uL (ref 0.0–0.7)
Eosinophils Relative: 0 % (ref 0–5)
HEMATOCRIT: 43.2 % (ref 39.0–52.0)
HEMOGLOBIN: 14.4 g/dL (ref 13.0–17.0)
Lymphocytes Relative: 16 % (ref 12–46)
Lymphs Abs: 1.2 10*3/uL (ref 0.7–4.0)
MCH: 28.9 pg (ref 26.0–34.0)
MCHC: 33.3 g/dL (ref 30.0–36.0)
MCV: 86.6 fL (ref 78.0–100.0)
MONOS PCT: 14 % — AB (ref 3–12)
Monocytes Absolute: 1.1 10*3/uL — ABNORMAL HIGH (ref 0.1–1.0)
Neutro Abs: 5.5 10*3/uL (ref 1.7–7.7)
Neutrophils Relative %: 70 % (ref 43–77)
PLATELETS: 176 10*3/uL (ref 150–400)
RBC: 4.99 MIL/uL (ref 4.22–5.81)
RDW: 13.7 % (ref 11.5–15.5)
WBC: 7.8 10*3/uL (ref 4.0–10.5)

## 2015-05-20 LAB — URINE MICROSCOPIC-ADD ON

## 2015-05-20 MED ORDER — IBUPROFEN 400 MG PO TABS
600.0000 mg | ORAL_TABLET | Freq: Once | ORAL | Status: AC
  Administered 2015-05-20: 600 mg via ORAL
  Filled 2015-05-20: qty 2

## 2015-05-20 MED ORDER — MORPHINE SULFATE 4 MG/ML IJ SOLN
4.0000 mg | Freq: Once | INTRAMUSCULAR | Status: AC
  Administered 2015-05-20: 4 mg via INTRAVENOUS
  Filled 2015-05-20: qty 1

## 2015-05-20 MED ORDER — SODIUM CHLORIDE 0.9 % IV BOLUS (SEPSIS)
1000.0000 mL | Freq: Once | INTRAVENOUS | Status: AC
Start: 2015-05-20 — End: 2015-05-21
  Administered 2015-05-20: 1000 mL via INTRAVENOUS

## 2015-05-20 MED ORDER — ONDANSETRON HCL 4 MG/2ML IJ SOLN
4.0000 mg | Freq: Once | INTRAMUSCULAR | Status: AC
  Administered 2015-05-20: 4 mg via INTRAVENOUS
  Filled 2015-05-20: qty 2

## 2015-05-20 MED ORDER — SODIUM CHLORIDE 0.9 % IV BOLUS (SEPSIS)
1000.0000 mL | Freq: Once | INTRAVENOUS | Status: AC
  Administered 2015-05-20: 1000 mL via INTRAVENOUS

## 2015-05-20 MED ORDER — ACETAMINOPHEN 325 MG PO TABS
650.0000 mg | ORAL_TABLET | Freq: Once | ORAL | Status: AC
  Administered 2015-05-20: 650 mg via ORAL
  Filled 2015-05-20: qty 2

## 2015-05-20 NOTE — ED Provider Notes (Signed)
CSN: [id number]     Arrival date & time 05/20/15  1825 History  This chart was scribed for Roxy Horseman, PA-C, working with Richardean Canal, MD by Chestine Spore, ED Scribe. The patient was seen in room TR07C/TR07C at 8:37 PM.    Chief Complaint  Patient presents with  . Generalized Body Aches      The history is provided by the patient. No language interpreter was used.     HPI Comments: Bradley Mullen is a 29 y.o. male with a medical hx of HIV who presents to the Emergency Department complaining of generalized body aches onset yesterday. He states that he is having associated symptoms of fever ranging of 100.6-103, nausea, sore throat, low back pain, lower leg pain, HA, and chills. He denies cough, vomiting, adbominal pain, dysuria, and any other associated symptoms. Denies kidney issues. Pt reports that he takes anti-virals and is followed by Dr. Drue Second and his last CD4 count was in the 600s and viral load was undetectable.   Past Medical History  Diagnosis Date  . HIV (human immunodeficiency virus infection)    History reviewed. No pertinent past surgical history. No family history on file. History  Substance Use Topics  . Smoking status: Never Smoker   . Smokeless tobacco: Never Used  . Alcohol Use: No    Review of Systems  Constitutional: Positive for fever and chills.  HENT: Positive for sore throat.   Respiratory: Negative for cough.   Gastrointestinal: Positive for nausea. Negative for vomiting and abdominal pain.  Genitourinary: Negative for dysuria.  Musculoskeletal: Positive for myalgias (generalized) and back pain (low).  Neurological: Positive for headaches.      Allergies  Review of patient's allergies indicates no known allergies.  Home Medications   Prior to Admission medications   Medication Sig Start Date End Date Taking? Authorizing Provider  Abacavir-Dolutegravir-Lamivud 600-50-300 MG TABS Take 1 tablet by mouth daily. 03/23/15   Judyann Munson, MD   dicyclomine (BENTYL) 10 MG capsule Take 1 capsule (10 mg total) by mouth 4 (four) times daily -  before meals and at bedtime. 02/17/15   Judyann Munson, MD  oxyCODONE-acetaminophen (ROXICET) 5-325 MG per tablet Take 1 tablet by mouth every 8 (eight) hours as needed for severe pain. Patient not taking: Reported on 02/17/2015 11/20/13   Pearline Cables, MD  tamsulosin (FLOMAX) 0.4 MG CAPS capsule Take 1 capsule (0.4 mg total) by mouth daily. Patient not taking: Reported on 02/17/2015 11/20/13   Gwenlyn Found Copland, MD   BP 137/74 mmHg  Pulse 86  Temp(Src) 101.2 F (38.4 C) (Oral)  Resp 20  Wt 167 lb 6.4 oz (75.932 kg)  SpO2 100% Physical Exam  Constitutional: He is oriented to person, place, and time. He appears well-developed and well-nourished. No distress.  HENT:  Head: Normocephalic and atraumatic.  Eyes: Conjunctivae and EOM are normal. Pupils are equal, round, and reactive to light. Right eye exhibits no discharge. Left eye exhibits no discharge. No scleral icterus.  Neck: Normal range of motion. Neck supple. No JVD present. No tracheal deviation present.  Cardiovascular: Normal rate, regular rhythm and normal heart sounds.  Exam reveals no gallop and no friction rub.   No murmur heard. Pulmonary/Chest: Effort normal and breath sounds normal. No respiratory distress. He has no wheezes. He has no rales. He exhibits no tenderness.  Abdominal: Soft. He exhibits no distension and no mass. There is no tenderness. There is no rebound and no guarding.  No focal abdominal tenderness,  no RLQ tenderness or pain at McBurney's point, no RUQ tenderness or Murphy's sign, no left-sided abdominal tenderness, no fluid wave, or signs of peritonitis   Musculoskeletal: Normal range of motion. He exhibits no edema or tenderness.  Neurological: He is alert and oriented to person, place, and time.  Skin: Skin is warm and dry.  Psychiatric: He has a normal mood and affect. His behavior is normal. Judgment and  thought content normal.  Nursing note and vitals reviewed.   ED Course  Procedures (including critical care time) DIAGNOSTIC STUDIES: Oxygen Saturation is 100% on RA, nl by my interpretation.    COORDINATION OF CARE: 8:40 PM-Discussed treatment plan which includes f/u with PCP for recheck of kidney levels, UA, labs, and IV fluids with pt at bedside and pt agreed to plan.   Labs Review Labs Reviewed  CBC WITH DIFFERENTIAL/PLATELET - Abnormal; Notable for the following:    Monocytes Relative 14 (*)    Monocytes Absolute 1.1 (*)    All other components within normal limits  URINALYSIS, ROUTINE W REFLEX MICROSCOPIC (NOT AT [facility]) - Abnormal; Notable for the following:    Color, Urine AMBER (*)    Hgb urine dipstick LARGE (*)    Ketones, ur 15 (*)    Protein, ur 30 (*)    Leukocytes, UA SMALL (*)    All other components within normal limits  URINE MICROSCOPIC-ADD ON - Abnormal; Notable for the following:    Casts HYALINE CASTS (*)    All other components within normal limits  I-STAT CHEM 8, ED - Abnormal; Notable for the following:    BUN 22 (*)    Creatinine, Ser 1.80 (*)    All other components within normal limits    Imaging Review Dg Chest 2 View  05/20/2015   CLINICAL DATA:  Acute onset of cough and fever.  Initial encounter.  EXAM: CHEST  2 VIEW  COMPARISON:  None.  FINDINGS: The lungs are well-aerated and clear. There is no evidence of focal opacification, pleural effusion or pneumothorax.  The heart is normal in size; the mediastinal contour is within normal limits. No acute osseous abnormalities are seen.  IMPRESSION: No acute cardiopulmonary process seen.   Electronically Signed   By: Roanna Raider M.D.   On: 05/20/2015 21:20     EKG Interpretation None      MDM   Final diagnoses:  Cough  Flank pain  Cystitis   Patient with generalized body aches, fever, nausea, and flank pain. Will check labs, will reassess. Creatinine is moderately elevated to 1.8. Discussed  this with Dr. Silverio Lay, who recommends fluids, and primary care follow-up for recheck. Also recommends chest x-ray and urinalysis. Chest x-ray is negative. Urinalysis still pending.  UA remarkable for RBCs, will check CT to rule out kidney stone. If negative, plan for discharge to home. IV fluids and pain medicine in the meantime.  12:56 AM CT scan remarkable for cystitis, will treat with throat cephalad and and Keflex. Patient seen by and discussed with Dr. Bebe Shaggy, who agrees the patient can be discharged home pending blood pressure greater than 100 and after IV anabiotic's. Patient is non-septic appearing. Will discharge home with Keflex. Recommend continuing fluids. Return precautions given. Patient understands and agrees with the plan. He is stable and ready for discharge.  Filed Vitals:   05/21/15 0101  BP: 112/65  Pulse: 80  Temp:   Resp: 14     I personally performed the services described in this documentation, which  was scribed in my presence. The recorded information has been reviewed and is accurate.     Roxy Horsemanobert Deshawn Skelley, PA-C 05/21/15 [id number]  Zadie Rhineonald Wickline, MD 05/21/15 [phone]

## 2015-05-20 NOTE — ED Notes (Signed)
Pt to ED with c/o generalized body aches, elevated temp and nausea,

## 2015-05-21 MED ORDER — CEFTRIAXONE SODIUM 1 G IJ SOLR
1.0000 g | Freq: Once | INTRAMUSCULAR | Status: AC
  Administered 2015-05-21: 1 g via INTRAVENOUS
  Filled 2015-05-21: qty 10

## 2015-05-21 MED ORDER — CEPHALEXIN 500 MG PO CAPS: 500.0000 mg | ORAL_CAPSULE | Freq: Four times a day (QID) | ORAL | Status: DC

## 2015-05-21 NOTE — ED Provider Notes (Signed)
Patient seen/examined in the Emergency Department in conjunction with Midlevel Provider  Patient reports myalgias Exam : awake/alert no distress  Plan: cystitis by Ct imaging.  Pt well appearing.  He would like to go home.  He is not septic appearing Will give rocephin load and likely d/c home    Zadie Rhineonald Karmel Patricelli, MD 05/21/15 [phone]

## 2015-05-21 NOTE — Discharge Instructions (Signed)

## 2015-05-23 ENCOUNTER — Emergency Department (HOSPITAL_COMMUNITY)
Admission: EM | Admit: 2015-05-23 | Discharge: 2015-05-23 | Disposition: A | Discharge status: Home / Self Care | Payer: BLUE CROSS/BLUE SHIELD | Attending: Emergency Medicine | Admitting: Emergency Medicine

## 2015-05-23 ENCOUNTER — Encounter (HOSPITAL_COMMUNITY): Payer: Self-pay | Admitting: Emergency Medicine

## 2015-05-23 DIAGNOSIS — Z79899 Other long term (current) drug therapy: Secondary | ICD-10-CM | POA: Diagnosis not present

## 2015-05-23 DIAGNOSIS — Z21 Asymptomatic human immunodeficiency virus [HIV] infection status: Secondary | ICD-10-CM | POA: Insufficient documentation

## 2015-05-23 DIAGNOSIS — J029 Acute pharyngitis, unspecified: Secondary | ICD-10-CM | POA: Insufficient documentation

## 2015-05-23 LAB — RAPID STREP SCREEN (MED CTR MEBANE ONLY): Streptococcus, Group A Screen (Direct): NEGATIVE

## 2015-05-23 MED ORDER — HYDROCODONE-ACETAMINOPHEN 7.5-325 MG/15ML PO SOLN: 15.0000 mL | Freq: Three times a day (TID) | ORAL | Status: DC | PRN

## 2015-05-23 MED ORDER — IBUPROFEN 600 MG PO TABS: 600.0000 mg | ORAL_TABLET | Freq: Four times a day (QID) | ORAL | Status: DC | PRN

## 2015-05-23 MED ORDER — DEXAMETHASONE SODIUM PHOSPHATE 10 MG/ML IJ SOLN
10.0000 mg | Freq: Once | INTRAMUSCULAR | Status: AC
  Administered 2015-05-23: 10 mg via INTRAMUSCULAR
  Filled 2015-05-23: qty 1

## 2015-05-23 NOTE — Discharge Instructions (Signed)
Pharyngitis °Pharyngitis is redness, pain, and swelling (inflammation) of your pharynx.  °CAUSES  °Pharyngitis is usually caused by infection. Most of the time, these infections are from viruses (viral) and are part of a cold. However, sometimes pharyngitis is caused by bacteria (bacterial). Pharyngitis can also be caused by allergies. Viral pharyngitis may be spread from person to person by coughing, sneezing, and personal items or utensils (cups, forks, spoons, toothbrushes). Bacterial pharyngitis may be spread from person to person by more intimate contact, such as kissing.  °SIGNS AND SYMPTOMS  °Symptoms of pharyngitis include:   °· Sore throat.   °· Tiredness (fatigue).   °· Low-grade fever.   °· Headache. °· Joint pain and muscle aches. °· Skin rashes. °· Swollen lymph nodes. °· Plaque-like film on throat or tonsils (often seen with bacterial pharyngitis). °DIAGNOSIS  °Your health care provider will ask you questions about your illness and your symptoms. Your medical history, along with a physical exam, is often all that is needed to diagnose pharyngitis. Sometimes, a rapid strep test is done. Other lab tests may also be done, depending on the suspected cause.  °TREATMENT  °Viral pharyngitis will usually get better in 3-4 days without the use of medicine. Bacterial pharyngitis is treated with medicines that kill germs (antibiotics).  °HOME CARE INSTRUCTIONS  °· Drink enough water and fluids to keep your urine clear or pale yellow.   °· Only take over-the-counter or prescription medicines as directed by your health care provider:   °¨ If you are prescribed antibiotics, make sure you finish them even if you start to feel better.   °¨ Do not take aspirin.   °· Get lots of rest.   °· Gargle with 8 oz of salt water (½ tsp of salt per 1 qt of water) as often as every 1-2 hours to soothe your throat.   °· Throat lozenges (if you are not at risk for choking) or sprays may be used to soothe your throat. °SEEK MEDICAL  CARE IF:  °· You have large, tender lumps in your neck. °· You have a rash. °· You cough up green, yellow-brown, or bloody spit. °SEEK IMMEDIATE MEDICAL CARE IF:  °· Your neck becomes stiff. °· You drool or are unable to swallow liquids. °· You vomit or are unable to keep medicines or liquids down. °· You have severe pain that does not go away with the use of recommended medicines. °· You have trouble breathing (not caused by a stuffy nose). °MAKE SURE YOU:  °· Understand these instructions. °· Will watch your condition. °· Will get help right away if you are not doing well or get worse. °Document Released: 12/03/2005 Document Revised: 09/23/2013 Document Reviewed: 08/10/2013 °ExitCare® Patient Information ©2015 [employer]. This information is not intended to replace advice given to you by your health care provider. Make sure you discuss any questions you have with your health care provider. ° °Salt Water Gargle °This solution will help make your mouth and throat feel better. °HOME CARE INSTRUCTIONS  °· Mix 1 teaspoon of salt in 8 ounces of warm water. °· Gargle with this solution as much or often as you need or as directed. Swish and gargle gently if you have any sores or wounds in your mouth. °· Do not swallow this mixture. °Document Released: 09/06/2004 Document Revised: 02/25/2012 Document Reviewed: 01/28/2009 °ExitCare® Patient Information ©2015 [employer]. This information is not intended to replace advice given to you by your health care provider. Make sure you discuss any questions you have with your health care provider. ° °

## 2015-05-23 NOTE — ED Notes (Signed)
Pt. reports sore throat onset last Friday , denies cough or fever . Respirations unlabored /airway intact .

## 2015-05-23 NOTE — ED Provider Notes (Signed)
CSN: [id number]     Arrival date & time 05/23/15  1949 History   First MD Initiated Contact with Patient 05/23/15 2105     Chief Complaint  Patient presents with  . Sore Throat    (Consider location/radiation/quality/duration/timing/severity/associated sxs/prior Treatment) HPI Comments: 29 year old male presents to the emergency department for further evaluation of sore throat 4 days. Patient states that he has noticed worsening symptoms since his ED presentation on 05/20/2015. Patient denies associated cough or fever. He states the pain is worse with swallowing, though he denies any inability to swallow or drooling. No shortness of breath. Patient has tried Tylenol, ibuprofen, and Tylenol with Codeine without relief of his pain. He is currently on Keflex for treatment of cystitis.  Patient is a 29 y.o. male presenting with pharyngitis. The history is provided by the patient. No language interpreter was used.  Sore Throat Associated symptoms include a sore throat. Pertinent negatives include no fever or vomiting.    Past Medical History  Diagnosis Date  . HIV (human immunodeficiency virus infection)    History reviewed. No pertinent past surgical history. No family history on file. History  Substance Use Topics  . Smoking status: Never Smoker   . Smokeless tobacco: Never Used  . Alcohol Use: No    Review of Systems  Constitutional: Negative for fever.  HENT: Positive for sore throat. Negative for drooling and trouble swallowing.   Respiratory: Negative for shortness of breath.   Gastrointestinal: Negative for vomiting.  All other systems reviewed and are negative.   Allergies  Review of patient's allergies indicates no known allergies.  Home Medications   Prior to Admission medications   Medication Sig Start Date End Date Taking? Authorizing Provider  Abacavir-Dolutegravir-Lamivud 600-50-300 MG TABS Take 1 tablet by mouth daily. 03/23/15   Judyann Munsonynthia Snider, MD  cephALEXin  (KEFLEX) 500 MG capsule Take 1 capsule (500 mg total) by mouth 4 (four) times daily. 05/21/15   Roxy Horsemanobert Browning, PA-C  dicyclomine (BENTYL) 10 MG capsule Take 1 capsule (10 mg total) by mouth 4 (four) times daily -  before meals and at bedtime. 02/17/15   Judyann Munsonynthia Snider, MD  HYDROcodone-acetaminophen (HYCET) 7.5-325 mg/15 ml solution Take 15 mLs by mouth every 8 (eight) hours as needed for moderate pain. 05/23/15   Antony MaduraKelly Benedicta Sultan, PA-C  ibuprofen (ADVIL,MOTRIN) 600 MG tablet Take 1 tablet (600 mg total) by mouth every 6 (six) hours as needed. 05/23/15   Antony MaduraKelly Hailley Byers, PA-C  oxyCODONE-acetaminophen (ROXICET) 5-325 MG per tablet Take 1 tablet by mouth every 8 (eight) hours as needed for severe pain. Patient not taking: Reported on 02/17/2015 11/20/13   Pearline CablesJessica C Copland, MD  tamsulosin (FLOMAX) 0.4 MG CAPS capsule Take 1 capsule (0.4 mg total) by mouth daily. Patient not taking: Reported on 02/17/2015 11/20/13   Gwenlyn FoundJessica C Copland, MD   BP 130/79 mmHg  Pulse 81  Temp(Src) 99 F (37.2 C) (Oral)  Resp 16  Ht 5' 10.5" (1.791 m)  Wt 165 lb (74.844 kg)  BMI 23.33 kg/m2  SpO2 100%   Physical Exam  Constitutional: He is oriented to person, place, and time. He appears well-developed and well-nourished. No distress.  Nontoxic/nonseptic appearing  HENT:  Head: Normocephalic and atraumatic.  Posterior oropharyngeal erythema. There are punctate ulcerations noted on the bilateral tonsils. Uvula midline. No angioedema. Patient tolerating secretions without difficulty.  Eyes: Conjunctivae and EOM are normal. No scleral icterus.  Neck: Normal range of motion.  No stridor  Cardiovascular: Normal rate, regular rhythm and  intact distal pulses.   Pulmonary/Chest: Effort normal. No respiratory distress. He has no wheezes. He has no rales.  Respirations even and unlabored  Musculoskeletal: Normal range of motion.  Neurological: He is alert and oriented to person, place, and time. He exhibits normal muscle tone. Coordination  normal.  Skin: Skin is warm and dry. No rash noted. He is not diaphoretic. No erythema. No pallor.  Psychiatric: He has a normal mood and affect. His behavior is normal.  Nursing note and vitals reviewed.   ED Course  Procedures (including critical care time) Labs Review Labs Reviewed  RAPID STREP SCREEN (NOT AT [facility])  CULTURE, GROUP A STREP  GC/CHLAMYDIA PROBE AMP ([place]) NOT AT [facility]    Imaging Review No results found.   EKG Interpretation None      MDM   Final diagnoses:  Viral pharyngitis    Pt afebrile without tonsillar exudate, negative strep. Presents with dysphagia; diagnosis of viral pharyngitis. Last CD4 count was 690. Patient does report unprotected oral sex with men; precautionary GC/Chlamydia drawn. He is currently on Keflex. Will discharge with symptomatic tx for pain. Decadron given in ED. Pt does not appear dehydrated, but did discuss importance of oral fluid hydration. Presentation not concerning for PTA or infxn spread to soft tissue. No trismus or uvula deviation. Primary care follow-up recommended and return precautions discussed and provided. Patient agreeable to plan with no unaddressed concerns. Patient discharged in good condition.   Filed Vitals:   05/23/15 2008  BP: 130/79  Pulse: 81  Temp: 99 F (37.2 C)  TempSrc: Oral  Resp: 16  Height: 5' 10.5" (1.791 m)  Weight: 165 lb (74.844 kg)  SpO2: 100%     Antony Madura, PA-C 05/23/15 2149  Benjiman Core, MD 05/24/15 [phone]

## 2015-05-24 LAB — GC/CHLAMYDIA PROBE AMP (~~LOC~~) NOT AT ARMC
Chlamydia: NEGATIVE
NEISSERIA GONORRHEA: NEGATIVE

## 2015-05-25 LAB — CULTURE, GROUP A STREP: STREP A CULTURE: NEGATIVE

## 2015-05-26 ENCOUNTER — Ambulatory Visit: Payer: Self-pay | Admitting: Internal Medicine

## 2015-06-14 ENCOUNTER — Ambulatory Visit: Payer: Self-pay | Admitting: Internal Medicine

## 2015-06-23 ENCOUNTER — Telehealth: Payer: Self-pay | Admitting: *Deleted

## 2015-06-23 ENCOUNTER — Ambulatory Visit (INDEPENDENT_AMBULATORY_CARE_PROVIDER_SITE_OTHER): Payer: Self-pay | Admitting: Internal Medicine

## 2015-06-23 ENCOUNTER — Encounter: Payer: Self-pay | Admitting: Internal Medicine

## 2015-06-23 VITALS — BP 171/75 | HR 64 | Temp 98.6°F | Wt 170.0 lb

## 2015-06-23 DIAGNOSIS — N179 Acute kidney failure, unspecified: Secondary | ICD-10-CM

## 2015-06-23 DIAGNOSIS — R030 Elevated blood-pressure reading, without diagnosis of hypertension: Secondary | ICD-10-CM

## 2015-06-23 DIAGNOSIS — Z113 Encounter for screening for infections with a predominantly sexual mode of transmission: Secondary | ICD-10-CM

## 2015-06-23 DIAGNOSIS — B20 Human immunodeficiency virus [HIV] disease: Secondary | ICD-10-CM

## 2015-06-23 LAB — COMPLETE METABOLIC PANEL WITH GFR
ALK PHOS: 51 U/L (ref 39–117)
ALT: 90 U/L — AB (ref 0–53)
AST: 36 U/L (ref 0–37)
Albumin: 4.6 g/dL (ref 3.5–5.2)
BILIRUBIN TOTAL: 0.5 mg/dL (ref 0.2–1.2)
BUN: 10 mg/dL (ref 6–23)
CHLORIDE: 100 meq/L (ref 96–112)
CO2: 27 mEq/L (ref 19–32)
Calcium: 9.5 mg/dL (ref 8.4–10.5)
Creat: 1.14 mg/dL (ref 0.50–1.35)
GFR, EST NON AFRICAN AMERICAN: 86 mL/min
GFR, Est African American: >89 mL/min
Glucose, Bld: 91 mg/dL (ref 70–99)
Potassium: 3.9 mEq/L (ref 3.5–5.3)
SODIUM: 139 meq/L (ref 135–145)
TOTAL PROTEIN: 7.7 g/dL (ref 6.0–8.3)

## 2015-06-23 LAB — CBC WITH DIFFERENTIAL/PLATELET
BASOS ABS: 0 10*3/uL (ref 0.0–0.1)
BASOS PCT: 1 % (ref 0–1)
Eosinophils Absolute: 0.2 10*3/uL (ref 0.0–0.7)
Eosinophils Relative: 4 % (ref 0–5)
HCT: 41.5 % (ref 39.0–52.0)
HEMOGLOBIN: 13.6 g/dL (ref 13.0–17.0)
Lymphocytes Relative: 54 % — ABNORMAL HIGH (ref 12–46)
Lymphs Abs: 2.6 10*3/uL (ref 0.7–4.0)
MCH: 28.6 pg (ref 26.0–34.0)
MCHC: 32.8 g/dL (ref 30.0–36.0)
MCV: 87.4 fL (ref 78.0–100.0)
MPV: 9.9 fL (ref 8.6–12.4)
Monocytes Absolute: 0.3 10*3/uL (ref 0.1–1.0)
Monocytes Relative: 7 % (ref 3–12)
Neutro Abs: 1.7 10*3/uL (ref 1.7–7.7)
Neutrophils Relative %: 34 % — ABNORMAL LOW (ref 43–77)
Platelets: 221 10*3/uL (ref 150–400)
RBC: 4.75 MIL/uL (ref 4.22–5.81)
RDW: 15.5 % (ref 11.5–15.5)
WBC: 4.9 10*3/uL (ref 4.0–10.5)

## 2015-06-23 NOTE — Progress Notes (Signed)
Patient ID: Bradley Mullen, male   DOB: Feb 07, 1986, 29 y.o.   MRN: [id number]       Patient ID: Bradley RadonMaurice Mullen, male   DOB: Feb 07, 1986, 29 y.o.   MRN: [id number]  HPI Bradley Mullen is a 29yo M with HIV disease doing well since transitioning to triumeq. No more gi symptoms. He states that he has occasional microscopic Hematuria noted intermittently over the last 2 years. He was given antibiotics for presumed uti but he denies dysuria and did not take meds. 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 Not in a relationship at the present  Outpatient Encounter Prescriptions as of 06/23/2015  Medication Sig  . Abacavir-Dolutegravir-Lamivud 600-50-300 MG TABS Take 1 tablet by mouth daily.  . cephALEXin (KEFLEX) 500 MG capsule Take 1 capsule (500 mg total) by mouth 4 (four) times daily. (Patient not taking: Reported on 06/23/2015)  . dicyclomine (BENTYL) 10 MG capsule Take 1 capsule (10 mg total) by mouth 4 (four) times daily -  before meals and at bedtime. (Patient not taking: Reported on 06/23/2015)  . [DISCONTINUED] HYDROcodone-acetaminophen (HYCET) 7.5-325 mg/15 ml solution Take 15 mLs by mouth every 8 (eight) hours as needed for moderate pain. (Patient not taking: Reported on 06/23/2015)  . [DISCONTINUED] ibuprofen (ADVIL,MOTRIN) 600 MG tablet Take 1 tablet (600 mg total) by mouth every 6 (six) hours as needed. (Patient not taking: Reported on 06/23/2015)  . [DISCONTINUED] oxyCODONE-acetaminophen (ROXICET) 5-325 MG per tablet Take 1 tablet by mouth every 8 (eight) hours as needed for severe pain. (Patient not taking: Reported on 02/17/2015)  . [DISCONTINUED] tamsulosin (FLOMAX) 0.4 MG CAPS capsule Take 1 capsule (0.4 mg total) by mouth daily. (Patient not taking: Reported on 02/17/2015)   No facility-administered encounter medications on file as of 06/23/2015.     Patient Active Problem List   Diagnosis Date Noted   . 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 PERSONAL HISTORY OF COLONIC POLYPS 11/14/2010  . HIV INFECTION 10/31/2010     There are no preventive care reminders to display for this patient.   Review of Systems 10 poiint ROS is negative, positive pertinents listed in hpi  Physical Exam   BP 171/75 mmHg  Pulse 64  Temp(Src) 98.6 F (37 C) (Oral)  Wt 170 lb (77.111 kg) Physical Exam  Constitutional: He is oriented to person, place, and time. He appears well-developed and well-nourished. No distress.  HENT:  Mouth/Throat: Oropharynx is clear and moist. No oropharyngeal exudate.  Cardiovascular: Normal rate, regular rhythm and normal heart sounds. Exam reveals no gallop and no friction rub.  No murmur heard.  Pulmonary/Chest: Effort normal and breath sounds normal. No respiratory distress. He has no wheezes.  Abdominal: Soft. Bowel sounds are normal. He exhibits no distension. There is no tenderness.  Lymphadenopathy:  He has no cervical adenopathy.  Neurological: He is alert and oriented to person, place, and time.  Skin: Skin is warm and dry. No rash noted. No erythema.  Psychiatric: He has a normal mood and affect. His behavior is normal.    Lab Results  Component Value Date   CD4TCELL 25* 12/15/2014   Lab Results  Component Value Date   CD4TABS 690 12/15/2014   CD4TABS 640 07/27/2014   CD4TABS 500 03/26/2014   Lab Results  Component Value Date   HIV1RNAQUANT <20 12/15/2014   Lab Results  Component Value Date   HEPBSAB POS* 10/31/2010  No results found for: RPR  CBC Lab Results  Component Value Date   WBC 7.8 05/20/2015   RBC 4.99 05/20/2015   HGB 16.7 05/20/2015   HCT 49.0 05/20/2015   PLT 176 05/20/2015   MCV 86.6 05/20/2015   MCH 28.9 05/20/2015   MCHC 33.3 05/20/2015   RDW 13.7 05/20/2015   LYMPHSABS 1.2 05/20/2015   MONOABS 1.1* 05/20/2015   EOSABS 0.0 05/20/2015   BASOSABS 0.0 05/20/2015   BMET Lab Results  Component Value Date   NA 138  05/20/2015   K 3.5 05/20/2015   CL 101 05/20/2015   CO2 28 02/17/2015   GLUCOSE 95 05/20/2015   BUN 22* 05/20/2015   CREATININE 1.80* 05/20/2015   CALCIUM 10.3 02/17/2015   GFRNONAA >89 02/17/2015   GFRAA >89 02/17/2015     Assessment and Plan   hiv disease = labs today, continue on triumeq, well controlled  Pre hypertension = will watch blood pressure, if still elevated at next visit, will need to treat  aki = noted on labs from recent UC/ED visit. Will repeat to ensure it is back to baseline.

## 2015-06-23 NOTE — Telephone Encounter (Signed)
Per Dr Drue SecondSnider trying to make the patient an appt for Urology. The patient does not have insurance so advised him of the [employer] and how to get one. He advised he will be insured 07/2015 and would like to wait until then to have the referral made. Gave him the information anyway for orange card and advised him if he changed his mind to give us a call either way. Either he gets the orange card or when his insurance becomes active. I can not make the referral until I have one or the other. Further advised him to call the office if the problem becomes more active.

## 2015-06-24 LAB — HIV-1 RNA QUANT-NO REFLEX-BLD: HIV 1 RNA Quant: <20 copies/mL (ref <20)

## 2015-06-24 LAB — T-HELPER CELL (CD4) - (RCID CLINIC ONLY)
CD4 % Helper T Cell: 26 % — ABNORMAL LOW (ref 33–55)
CD4 T Cell Abs: 640 /uL (ref 400–2700)

## 2015-06-24 LAB — RPR

## 2015-11-16 ENCOUNTER — Other Ambulatory Visit: Payer: Self-pay | Admitting: Internal Medicine

## 2015-11-16 DIAGNOSIS — B20 Human immunodeficiency virus [HIV] disease: Secondary | ICD-10-CM

## 2015-12-19 ENCOUNTER — Other Ambulatory Visit: Payer: Self-pay | Admitting: Internal Medicine

## 2015-12-19 DIAGNOSIS — B20 Human immunodeficiency virus [HIV] disease: Secondary | ICD-10-CM

## 2016-02-24 ENCOUNTER — Other Ambulatory Visit: Payer: Self-pay | Admitting: Internal Medicine

## 2016-02-24 ENCOUNTER — Telehealth: Payer: Self-pay | Admitting: *Deleted

## 2016-02-24 DIAGNOSIS — B20 Human immunodeficiency virus [HIV] disease: Secondary | ICD-10-CM

## 2016-02-24 NOTE — Telephone Encounter (Signed)
Patient reqeusting refill of triumeq via pharmacy.  Patient was to follow up 09/2015, has not been seen since 06/2015.  He has been reminded to call for an appointment. One last refill sent.  RN left generic messages on phone numbers listed asking him to call asap for an appointment. Andree CossHowell, Winda Summerall M, RN

## 2016-03-12 ENCOUNTER — Encounter: Payer: Self-pay | Admitting: Internal Medicine

## 2016-03-12 ENCOUNTER — Ambulatory Visit (INDEPENDENT_AMBULATORY_CARE_PROVIDER_SITE_OTHER): Payer: Self-pay | Admitting: Internal Medicine

## 2016-03-12 VITALS — BP 148/90 | HR 76 | Temp 97.3°F | Wt 174.0 lb

## 2016-03-12 DIAGNOSIS — B20 Human immunodeficiency virus [HIV] disease: Secondary | ICD-10-CM

## 2016-03-12 DIAGNOSIS — Z79899 Other long term (current) drug therapy: Secondary | ICD-10-CM

## 2016-03-12 DIAGNOSIS — A64 Unspecified sexually transmitted disease: Secondary | ICD-10-CM

## 2016-03-12 DIAGNOSIS — Z113 Encounter for screening for infections with a predominantly sexual mode of transmission: Secondary | ICD-10-CM

## 2016-03-12 LAB — COMPLETE METABOLIC PANEL WITH GFR
ALT: 41 U/L (ref 9–46)
AST: 26 U/L (ref 10–40)
Albumin: 4.3 g/dL (ref 3.6–5.1)
Alkaline Phosphatase: 37 U/L — ABNORMAL LOW (ref 40–115)
BILIRUBIN TOTAL: 0.4 mg/dL (ref 0.2–1.2)
BUN: 12 mg/dL (ref 7–25)
CO2: 25 mmol/L (ref 20–31)
Calcium: 9.1 mg/dL (ref 8.6–10.3)
Chloride: 104 mmol/L (ref 98–110)
Creat: 1.08 mg/dL (ref 0.60–1.35)
GFR, Est African American: >89 mL/min (ref >=60)
GLUCOSE: 84 mg/dL (ref 65–99)
Potassium: 4.5 mmol/L (ref 3.5–5.3)
Sodium: 141 mmol/L (ref 135–146)
Total Protein: 7.4 g/dL (ref 6.1–8.1)

## 2016-03-12 LAB — CBC WITH DIFFERENTIAL/PLATELET
BASOS ABS: 0 10*3/uL (ref 0.0–0.1)
BASOS PCT: 0 % (ref 0–1)
EOS ABS: 0.2 10*3/uL (ref 0.0–0.7)
Eosinophils Relative: 3 % (ref 0–5)
HCT: 39.3 % (ref 39.0–52.0)
Hemoglobin: 13.1 g/dL (ref 13.0–17.0)
Lymphocytes Relative: 44 % (ref 12–46)
Lymphs Abs: 2.2 10*3/uL (ref 0.7–4.0)
MCH: 29.1 pg (ref 26.0–34.0)
MCHC: 33.3 g/dL (ref 30.0–36.0)
MCV: 87.3 fL (ref 78.0–100.0)
MPV: 9.9 fL (ref 8.6–12.4)
Monocytes Absolute: 0.4 10*3/uL (ref 0.1–1.0)
Monocytes Relative: 7 % (ref 3–12)
NEUTROS ABS: 2.3 10*3/uL (ref 1.7–7.7)
NEUTROS PCT: 46 % (ref 43–77)
PLATELETS: 228 10*3/uL (ref 150–400)
RBC: 4.5 MIL/uL (ref 4.22–5.81)
RDW: 14.3 % (ref 11.5–15.5)
WBC: 5.1 10*3/uL (ref 4.0–10.5)

## 2016-03-12 LAB — LIPID PANEL
Cholesterol: 127 mg/dL (ref 125–200)
HDL: 44 mg/dL (ref >=40)
LDL CALC: 77 mg/dL (ref <130)
TRIGLYCERIDES: 32 mg/dL (ref <150)
Total CHOL/HDL Ratio: 2.9 Ratio (ref <=5.0)
VLDL: 6 mg/dL (ref <30)

## 2016-03-12 MED ORDER — ABACAVIR-DOLUTEGRAVIR-LAMIVUD 600-50-300 MG PO TABS: 1.0000 | ORAL_TABLET | Freq: Every day | ORAL | Status: DC

## 2016-03-12 NOTE — Progress Notes (Signed)
Patient ID: Bradley Mullen, male   DOB: 04-27-1986, 30 y.o.   MRN: [id number]       Patient ID: Bradley Mullen, male   DOB: 04-27-1986, 30 y.o.   MRN: [id number]  HPI  29yo M with HIV disease, Cd 4 count of 640/VL<20, on triumeq.he reports doing well with triumeq. He states that he has occasional nasal congestion but feeling fine overall.  Family hx : hypertension  Outpatient Encounter Prescriptions as of 03/12/2016  Medication Sig  . abacavir-dolutegravir-lamiVUDine (TRIUMEQ) 600-50-300 MG tablet Take 1 tablet by mouth daily. MUST BE SEEN FOR FUTURE REFILLS [phone]   No facility-administered encounter medications on file as of 03/12/2016.     Patient Active Problem List   Diagnosis Date Noted  . 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 PERSONAL HISTORY OF COLONIC POLYPS 11/14/2010  . HIV INFECTION 10/31/2010     Health Maintenance Due  Topic Date Due  . INFLUENZA VACCINE  07/18/2015     Review of Systems Review of Systems  Constitutional: Negative for fever, chills, diaphoresis, activity change, appetite change, fatigue and unexpected weight change.  HENT: Negative for congestion, sore throat, rhinorrhea, sneezing, trouble swallowing and sinus pressure.  Eyes: Negative for photophobia and visual disturbance.  Respiratory: Negative for cough, chest tightness, shortness of breath, wheezing and stridor.  Cardiovascular: Negative for chest pain, palpitations and leg swelling.  Gastrointestinal: Negative for nausea, vomiting, abdominal pain, diarrhea, constipation, blood in stool, abdominal distention and anal bleeding.  Genitourinary: Negative for dysuria, hematuria, flank pain and difficulty urinating.  Musculoskeletal: Negative for myalgias, back pain, joint swelling, arthralgias and gait problem.  Skin: Negative for color change, pallor, rash and wound.  Neurological: Negative for dizziness, tremors, weakness and light-headedness.  Hematological: Negative for adenopathy. Does  not bruise/bleed easily.  Psychiatric/Behavioral: Negative for behavioral problems, confusion, sleep disturbance, dysphoric mood, decreased concentration and agitation.    Physical Exam   BP 148/90 mmHg  Pulse 76  Temp(Src) 97.3 F (36.3 C) (Oral)  Wt 174 lb (78.926 kg) Physical Exam  Constitutional: He is oriented to person, place, and time. He appears well-developed and well-nourished. No distress.  HENT:  Mouth/Throat: Oropharynx is clear and moist. No oropharyngeal exudate.  Cardiovascular: Normal rate, regular rhythm and normal heart sounds. Exam reveals no gallop and no friction rub.  No murmur heard.  Pulmonary/Chest: Effort normal and breath sounds normal. No respiratory distress. He has no wheezes.  Abdominal: Soft. Bowel sounds are normal. He exhibits no distension. There is no tenderness.  Lymphadenopathy:  He has no cervical adenopathy.  Neurological: He is alert and oriented to person, place, and time.  Skin: Skin is warm and dry. No rash noted. No erythema.  Psychiatric: He has a normal mood and affect. His behavior is normal.    Lab Results  Component Value Date   CD4TCELL 26* 06/23/2015   Lab Results  Component Value Date   CD4TABS 640 06/23/2015   CD4TABS 690 12/15/2014   CD4TABS 640 07/27/2014   Lab Results  Component Value Date   HIV1RNAQUANT <20 06/23/2015   Lab Results  Component Value Date   HEPBSAB POS* 10/31/2010   No results found for: RPR  CBC Lab Results  Component Value Date   WBC 4.9 06/23/2015   RBC 4.75 06/23/2015   HGB 13.6 06/23/2015   HCT 41.5 06/23/2015   PLT 221 06/23/2015   MCV 87.4 06/23/2015   MCH 28.6 06/23/2015   MCHC 32.8 06/23/2015   RDW 15.5 06/23/2015   LYMPHSABS  2.6 06/23/2015   MONOABS 0.3 06/23/2015   EOSABS 0.2 06/23/2015   BASOSABS 0.0 06/23/2015   BMET Lab Results  Component Value Date   NA 139 06/23/2015   K 3.9 06/23/2015   CL 100 06/23/2015   CO2 27 06/23/2015   GLUCOSE 91 06/23/2015   BUN 10  06/23/2015   CREATININE 1.14 06/23/2015   CALCIUM 9.5 06/23/2015   GFRNONAA 86 06/23/2015   GFRAA >89 06/23/2015     Assessment and Plan   hiv disease = continue on triumeq since he appears well controlled from last labs though> 27mo old. Will check labs today. Give refill  htn = will start on HCTZ  daily  Health maintenance = will give flu vax, pneumococcal vaccine booster  sti screening = will check rpr and std

## 2016-03-13 LAB — HIV-1 RNA QUANT-NO REFLEX-BLD
HIV 1 RNA Quant: <20 copies/mL (ref <20)
HIV-1 RNA Quant, Log: <1.3 Log copies/mL (ref <1.30)

## 2016-03-13 LAB — T-HELPER CELL (CD4) - (RCID CLINIC ONLY)
CD4 T CELL HELPER: 27 % — AB (ref 33–55)
CD4 T Cell Abs: 590 /uL (ref 400–2700)

## 2016-03-13 LAB — CYTOLOGY, (ORAL, ANAL, URETHRAL) ANCILLARY ONLY
CHLAMYDIA, DNA PROBE: NEGATIVE
Chlamydia: NEGATIVE
NEISSERIA GONORRHEA: NEGATIVE
Neisseria Gonorrhea: NEGATIVE

## 2016-03-13 LAB — RPR

## 2016-04-23 IMAGING — CT CT ABD-PELV W/O CM
2 of 4 series · 17 of 46 positions shown, 19 images · non-contrast
Comparison: Renal ultrasound performed 03/30/2008

CLINICAL DATA: Acute onset of nausea, sore throat and lower back
pain. Headache and chills. Initial encounter.

EXAM:
CT ABDOMEN AND PELVIS WITHOUT CONTRAST
TECHNIQUE: Multidetector CT imaging of the abdomen and pelvis was performed
following the standard protocol without IV contrast.

[Series 2: abd/ pelvis 5.0 i30f 1 · axial · 0.69mm/px · z∈[-888,-478]mm · 14 of 90 slices shown, 16 images]
[im 4/90  soft-tissue]
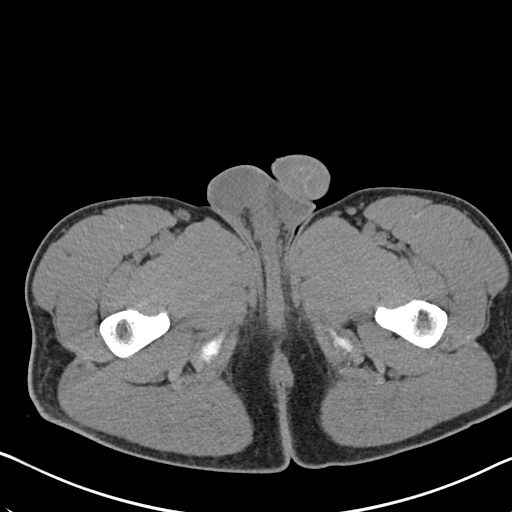
[im 4/90  bone]
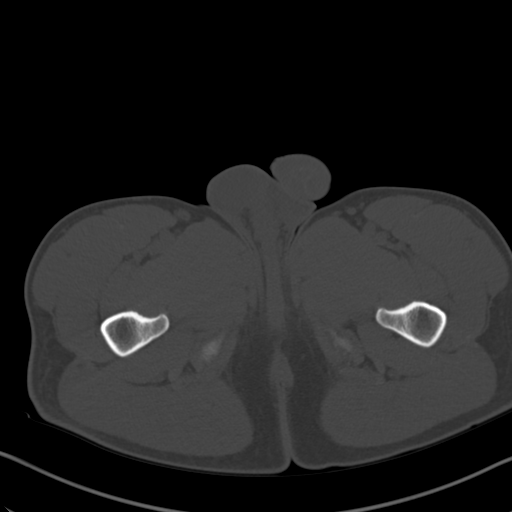
[im 12/90  soft-tissue]
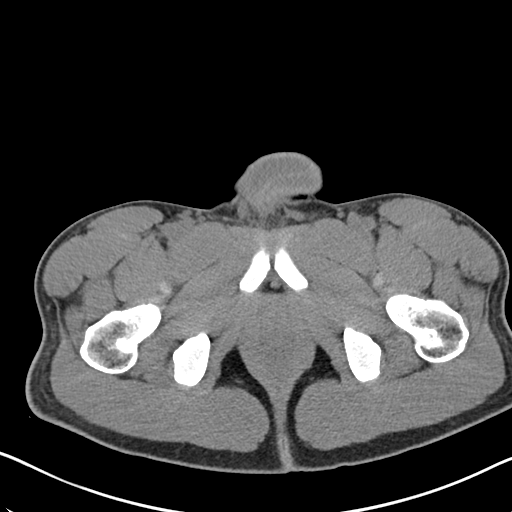
[im 19/90  soft-tissue]
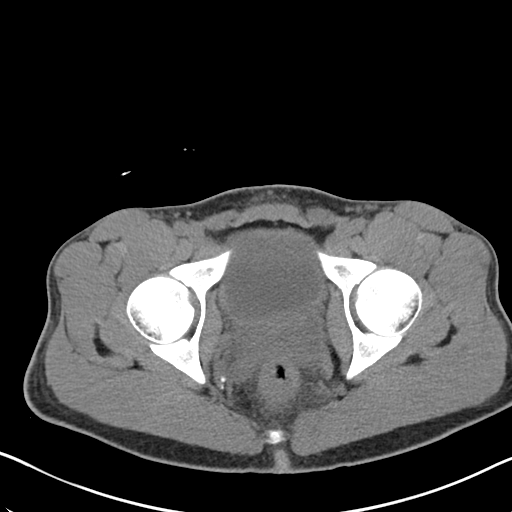
[im 23/90  soft-tissue]
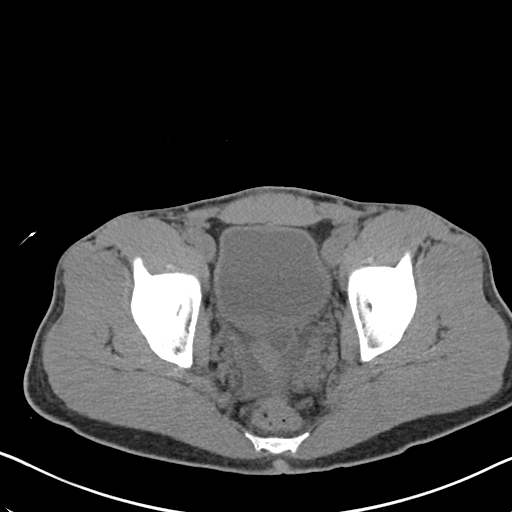
[im 30/90  soft-tissue]
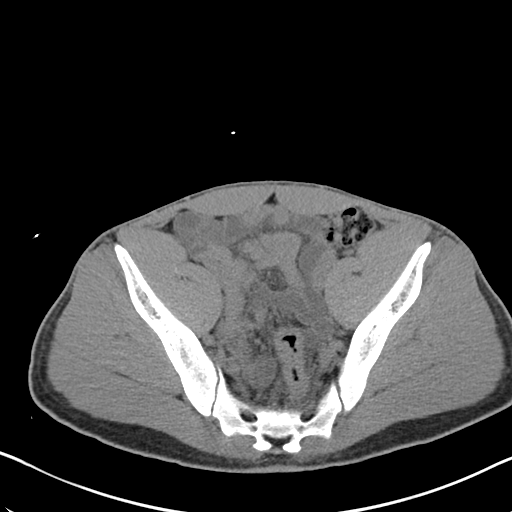
[im 38/90  soft-tissue]
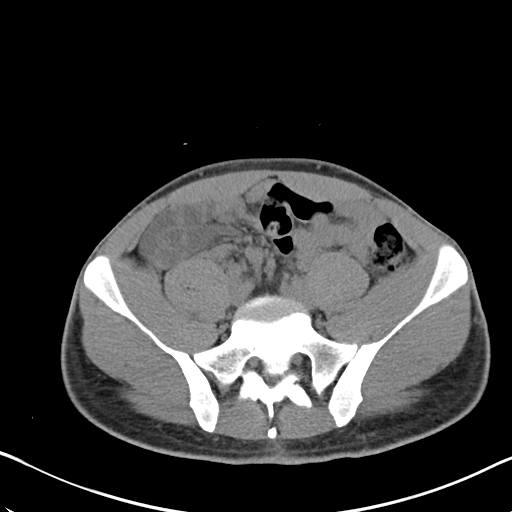
[im 41/90  soft-tissue]
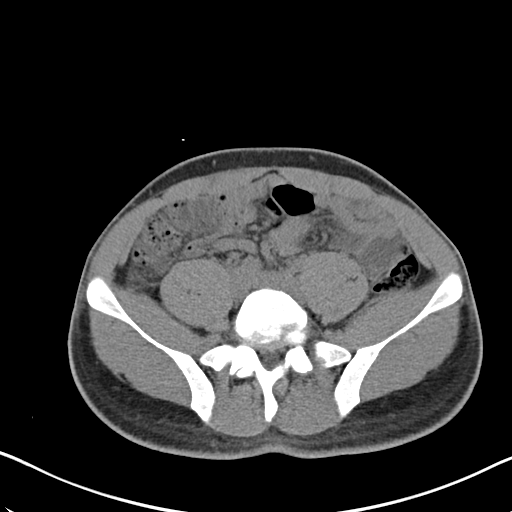
[im 49/90  soft-tissue]
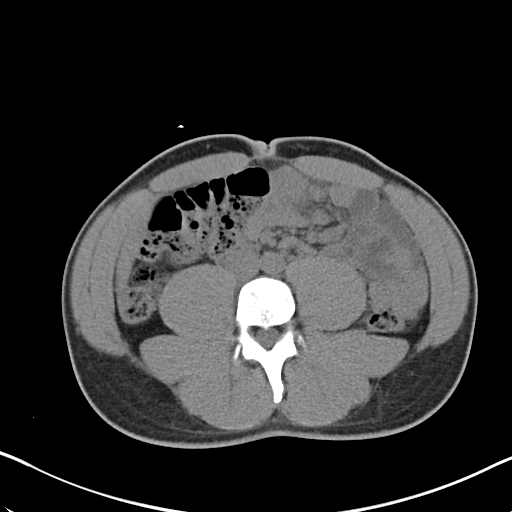
[im 52/90  soft-tissue]
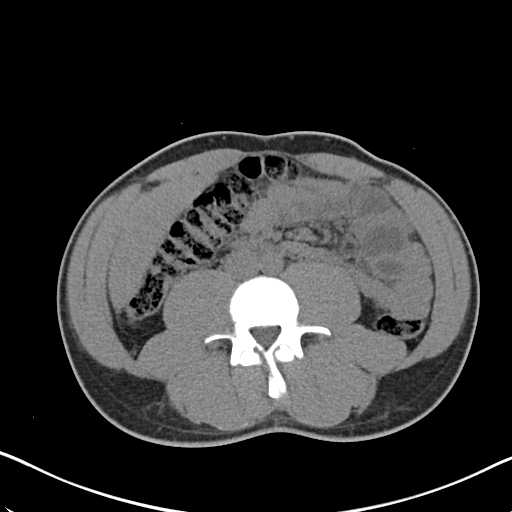
[im 52/90  bone]
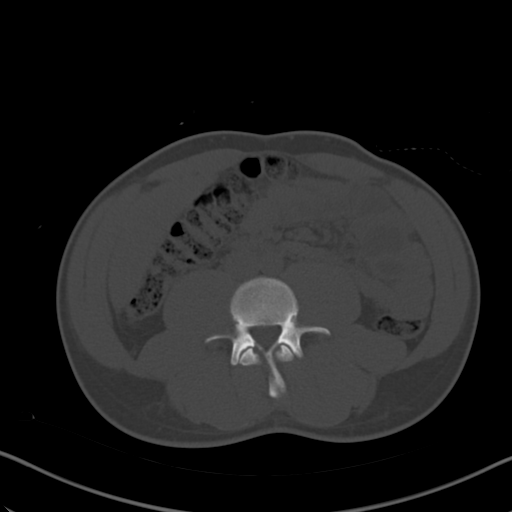
[im 60/90  soft-tissue]
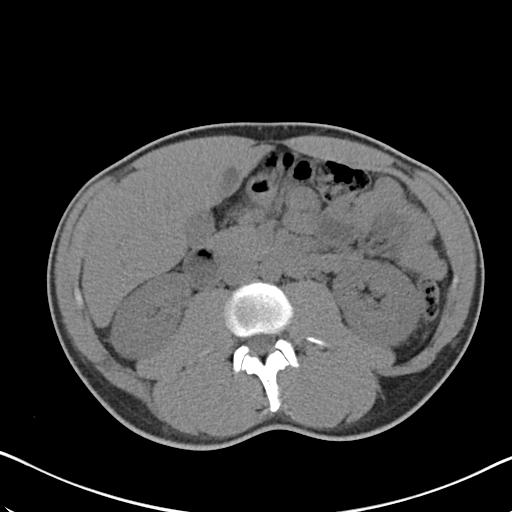
[im 67/90  soft-tissue]
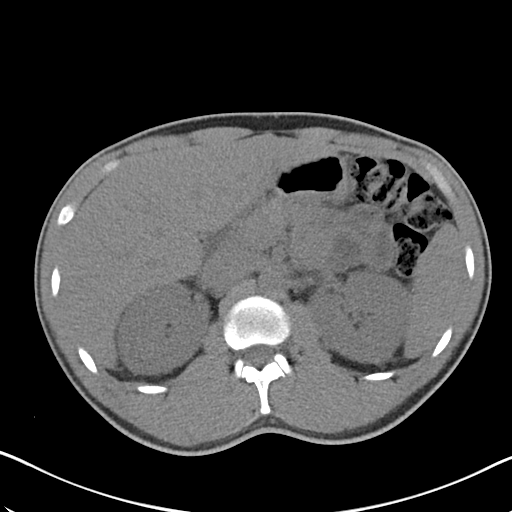
[im 71/90  soft-tissue]
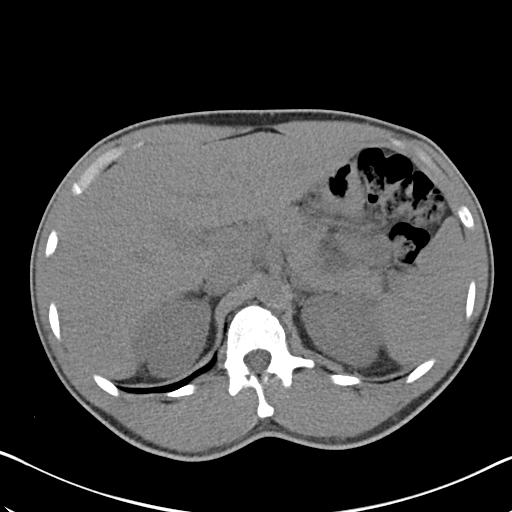
[im 78/90  soft-tissue]
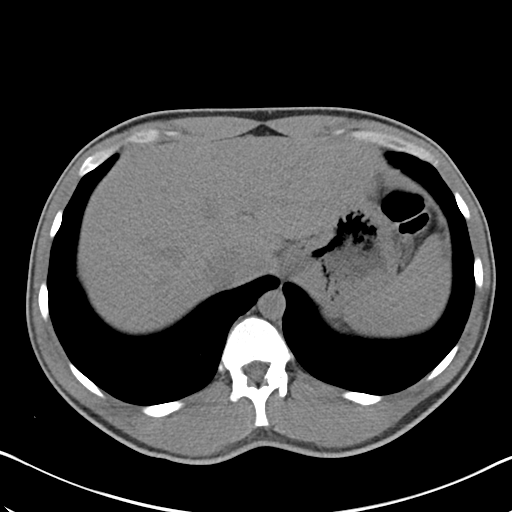
[im 86/90  soft-tissue]
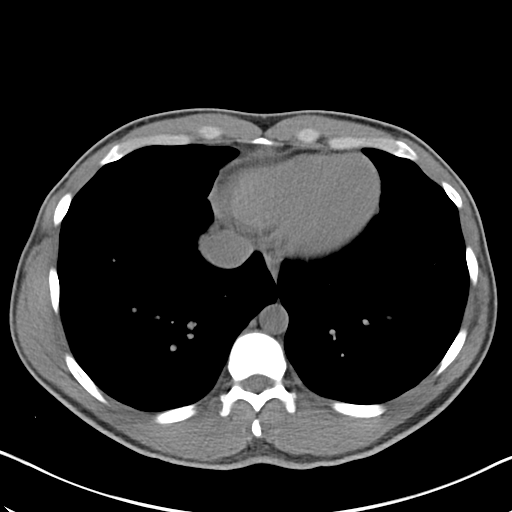

[Series 602: cor · coronal · 0.88mm/px · 3 of 122 slices shown]
[im 41/122  soft-tissue]
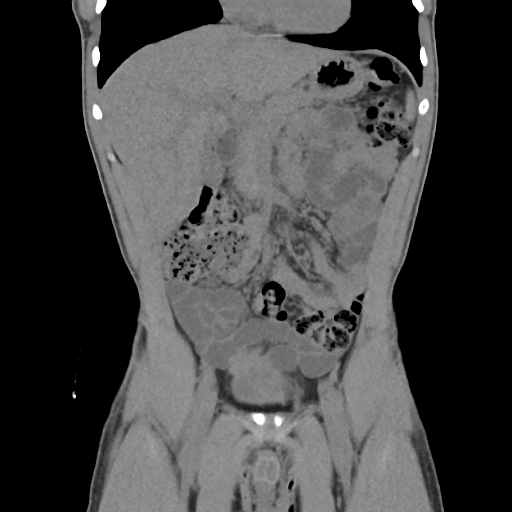
[im 54/122  soft-tissue]
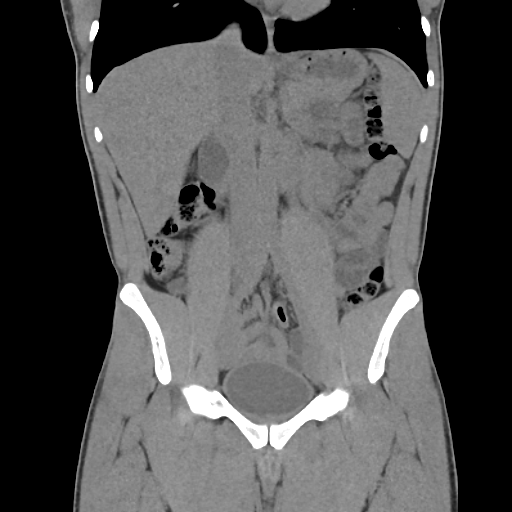
[im 68/122  soft-tissue]
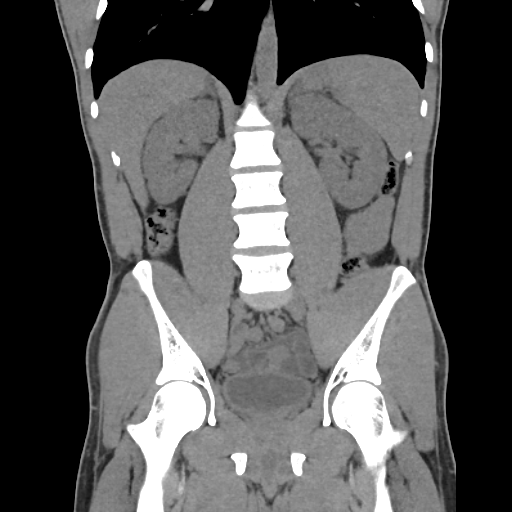

[17 of 46 positions shown; findings below may reference images not displayed]

FINDINGS: The visualized lung bases are clear.

The liver and spleen are unremarkable in appearance. The gallbladder
is within normal limits. The pancreas and adrenal glands are
unremarkable.

The kidneys are unremarkable in appearance. There is no evidence of
hydronephrosis. No renal or ureteral stones are seen. No perinephric
stranding is appreciated.

No free fluid is identified. The small bowel is unremarkable in
appearance. The stomach is within normal limits. No acute vascular
abnormalities are seen.

The appendix is grossly unremarkable in appearance, though difficult
to fully assess. There is no evidence for appendicitis. The colon is
unremarkable in appearance.

The bladder is mildly distended. Mild apparent bladder wall
thickening is noted, slightly irregular in appearance. The prostate
remains normal in size. No inguinal lymphadenopathy is seen.

No acute osseous abnormalities are identified.
IMPRESSION: 1. Mild bladder wall thickening may be within normal limits, or
could reflect mild cystitis. Would correlate for associated
symptoms.
2. Otherwise unremarkable noncontrast CT of the abdomen and pelvis.

## 2016-04-24 ENCOUNTER — Telehealth: Payer: Self-pay | Admitting: *Deleted

## 2016-04-24 ENCOUNTER — Encounter: Payer: Self-pay | Admitting: Internal Medicine

## 2016-04-24 NOTE — Telephone Encounter (Signed)
Patient's pharmacy asking for advice. Patient reports severe constipation that require enemas while on Triumeq.  He has been out of medication since Saturday, feels that his symptoms are improved just by stopping Triumeq.  Please advise. Andree CossHowell, Mckennah Kretchmer M, RN

## 2016-05-11 ENCOUNTER — Other Ambulatory Visit: Payer: Self-pay | Admitting: Internal Medicine

## 2016-05-22 ENCOUNTER — Other Ambulatory Visit (INDEPENDENT_AMBULATORY_CARE_PROVIDER_SITE_OTHER): Payer: Self-pay

## 2016-05-22 DIAGNOSIS — Z113 Encounter for screening for infections with a predominantly sexual mode of transmission: Secondary | ICD-10-CM

## 2016-05-22 DIAGNOSIS — B20 Human immunodeficiency virus [HIV] disease: Secondary | ICD-10-CM

## 2016-05-23 ENCOUNTER — Telehealth: Payer: Self-pay | Admitting: *Deleted

## 2016-05-23 LAB — HIV-1 RNA QUANT-NO REFLEX-BLD

## 2016-05-23 LAB — T-HELPER CELL (CD4) - (RCID CLINIC ONLY)
CD4 T CELL HELPER: 25 % — AB (ref 33–55)
CD4 T Cell Abs: 660 /uL (ref 400–2700)

## 2016-05-23 LAB — URINE CYTOLOGY ANCILLARY ONLY
CHLAMYDIA, DNA PROBE: NEGATIVE
Neisseria Gonorrhea: NEGATIVE

## 2016-05-23 LAB — RPR

## 2016-05-23 NOTE — Telephone Encounter (Signed)
Patient calling for results of his labs, specifically the RPR.  He states that he has a spot he is worried about around his anus. He describes this as a flat growth.  RN discussed HPV with patient, suggested he discuss this at his upcoming appointment 7/11.  Patient will have insurance 7/1, is planning on establishing primary care at [facility].   Andree CossHowell, Stephanieann Popescu M, RN

## 2016-06-26 ENCOUNTER — Encounter: Payer: Self-pay | Admitting: Internal Medicine

## 2016-06-26 ENCOUNTER — Ambulatory Visit (INDEPENDENT_AMBULATORY_CARE_PROVIDER_SITE_OTHER): Payer: Self-pay | Admitting: Internal Medicine

## 2016-06-26 VITALS — BP 143/89 | HR 73 | Temp 98.6°F | Wt 184.0 lb

## 2016-06-26 DIAGNOSIS — K5900 Constipation, unspecified: Secondary | ICD-10-CM

## 2016-06-26 DIAGNOSIS — B20 Human immunodeficiency virus [HIV] disease: Secondary | ICD-10-CM

## 2016-06-26 DIAGNOSIS — R030 Elevated blood-pressure reading, without diagnosis of hypertension: Secondary | ICD-10-CM

## 2016-06-26 DIAGNOSIS — R5383 Other fatigue: Secondary | ICD-10-CM

## 2016-06-26 NOTE — Progress Notes (Signed)
Patient ID: Bradley Mullen, male   DOB: October 05, 1986, 30 y.o.   MRN: [id number]       Patient ID: Bradley Mullen, male   DOB: Oct 05, 1986, 30 y.o.   MRN: [id number]  HPI Bradley Mullen is a 30yo M with HIV disease, CD 4 count of 660/VL<20 on triumeq with excellent adherence though he is having significant constipation.(previously had been on stribild but did not tolerate) 2 BM per week over the last 6-7 months. He has tried increasing water intake, fiber, occ laxatives.  He had did manual self disimpaction which caused irritation. No blood in stool. No hemorrhoids  In 1996, he was dx with benign colon polyps (8 removed) but was not told to do further follow up. His grandfather died of colon ca in his 10s. No other family members with colon polyps  seen 3 months ago at last visit where we discussed doing diet modification before starting hctz for hypertension. He does have salt in his diet for which he is attempting to decrease. HTN runs in his family   No sex since last visit. He is seeing someone though they have not been intimate at this time  Outpatient Encounter Prescriptions as of 06/26/2016  Medication Sig  . TRIUMEQ 600-50-300 MG tablet TAKE 1 TABLET BY MOUTH DAILY.   No facility-administered encounter medications on file as of 06/26/2016.     Patient Active Problem List   Diagnosis Date Noted  . 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 PERSONAL HISTORY OF COLONIC POLYPS 11/14/2010  . HIV INFECTION 10/31/2010     There are no preventive care reminders to display for this patient.   Review of Systems Constipation, fatigue. 10 point ros is negative Physical Exam   BP 143/89 mmHg  Pulse 73  Temp(Src) 98.6 F (37 C) (Oral)  Wt 184 lb (83.462 kg) Physical Exam  Constitutional: He is oriented to person, place, and time. He appears well-developed and well-nourished. No distress.  HENT:  Mouth/Throat: Oropharynx is clear and moist. No oropharyngeal exudate.  Cardiovascular: Normal rate,  regular rhythm and normal heart sounds. Exam reveals no gallop and no friction rub.  No murmur heard.  Pulmonary/Chest: Effort normal and breath sounds normal. No respiratory distress. He has no wheezes.  Abdominal: Soft. Bowel sounds are normal. He exhibits no distension. There is no tenderness.  Gu: no hemorrhoids nor any anal warts Lymphadenopathy:  He has no cervical adenopathy.  Neurological: He is alert and oriented to person, place, and time.  Skin: Skin is warm and dry. No rash noted. No erythema.  Psychiatric: He has a normal mood and affect. His behavior is normal.     Lab Results  Component Value Date   CD4TCELL 25* 05/22/2016   Lab Results  Component Value Date   CD4TABS 660 05/22/2016   CD4TABS 590 03/12/2016   CD4TABS 640 06/23/2015   Lab Results  Component Value Date   HIV1RNAQUANT <20 05/22/2016   Lab Results  Component Value Date   HEPBSAB POS* 10/31/2010   No results found for: RPR  CBC Lab Results  Component Value Date   WBC 5.1 03/12/2016   RBC 4.50 03/12/2016   HGB 13.1 03/12/2016   HCT 39.3 03/12/2016   PLT 228 03/12/2016   MCV 87.3 03/12/2016   MCH 29.1 03/12/2016   MCHC 33.3 03/12/2016   RDW 14.3 03/12/2016   LYMPHSABS 2.2 03/12/2016   MONOABS 0.4 03/12/2016   EOSABS 0.2 03/12/2016   BASOSABS 0.0 03/12/2016   BMET Lab Results  Component Value Date   NA 141 03/12/2016   K 4.5 03/12/2016   CL 104 03/12/2016   CO2 25 03/12/2016   GLUCOSE 84 03/12/2016   BUN 12 03/12/2016   CREATININE 1.08 03/12/2016   CALCIUM 9.1 03/12/2016   GFRNONAA >89 03/12/2016   GFRAA >89 03/12/2016     Assessment and Plan  hiv infection = well controlled. Patient wants to continue on same regimen despite constipation, which he feels started since taking triumeq in the past year  Constipation = in addn to fatigue, will check thyroid function test. Will give him miralax or combination of colace/bisacodyl  Pre hypertension = he is will to do further diet  modification. Asked him to also check BP when he is in grocery store/pharmacy to see if can have trend outside of office  Hx of colon polyps = will sidebar GI to see if needs colonoscopy screening sooner than later

## 2016-06-27 LAB — T4, FREE: FREE T4: 0.8 ng/dL (ref 0.8–1.8)

## 2016-06-27 LAB — TSH: TSH: 0.75 mIU/L (ref 0.40–4.50)

## 2016-10-15 ENCOUNTER — Other Ambulatory Visit: Payer: Self-pay

## 2016-10-15 DIAGNOSIS — B20 Human immunodeficiency virus [HIV] disease: Secondary | ICD-10-CM

## 2016-10-15 LAB — CBC
HCT: 38.1 % — ABNORMAL LOW (ref 38.5–50.0)
Hemoglobin: 12.7 g/dL — ABNORMAL LOW (ref 13.2–17.1)
MCH: 28.3 pg (ref 27.0–33.0)
MCHC: 33.3 g/dL (ref 32.0–36.0)
MCV: 85 fL (ref 80.0–100.0)
MPV: 10.1 fL (ref 7.5–12.5)
PLATELETS: 221 10*3/uL (ref 140–400)
RBC: 4.48 MIL/uL (ref 4.20–5.80)
RDW: 13.9 % (ref 11.0–15.0)
WBC: 4.9 10*3/uL (ref 3.8–10.8)

## 2016-10-15 LAB — COMPREHENSIVE METABOLIC PANEL
ALBUMIN: 4.7 g/dL (ref 3.6–5.1)
ALT: 23 U/L (ref 9–46)
AST: 19 U/L (ref 10–40)
Alkaline Phosphatase: 34 U/L — ABNORMAL LOW (ref 40–115)
BUN: 15 mg/dL (ref 7–25)
CHLORIDE: 104 mmol/L (ref 98–110)
CO2: 26 mmol/L (ref 20–31)
Calcium: 9.6 mg/dL (ref 8.6–10.3)
Creat: 1.02 mg/dL (ref 0.60–1.35)
Glucose, Bld: 86 mg/dL (ref 65–99)
POTASSIUM: 4.1 mmol/L (ref 3.5–5.3)
Sodium: 140 mmol/L (ref 135–146)
TOTAL PROTEIN: 8 g/dL (ref 6.1–8.1)
Total Bilirubin: 0.4 mg/dL (ref 0.2–1.2)

## 2016-10-16 LAB — T-HELPER CELL (CD4) - (RCID CLINIC ONLY)
CD4 T CELL ABS: 530 /uL (ref 400–2700)
CD4 T CELL HELPER: 22 % — AB (ref 33–55)

## 2016-10-16 LAB — HIV-1 RNA QUANT-NO REFLEX-BLD
HIV 1 RNA QUANT: 3426 {copies}/mL — AB (ref <20)
HIV-1 RNA QUANT, LOG: 3.53 {Log_copies}/mL — AB (ref <1.30)

## 2016-10-29 ENCOUNTER — Encounter: Payer: Self-pay | Admitting: Internal Medicine

## 2016-10-29 ENCOUNTER — Ambulatory Visit (INDEPENDENT_AMBULATORY_CARE_PROVIDER_SITE_OTHER): Payer: Self-pay | Admitting: Internal Medicine

## 2016-10-29 VITALS — BP 153/80 | HR 94 | Temp 97.7°F | Ht 70.5 in | Wt 165.0 lb

## 2016-10-29 DIAGNOSIS — B20 Human immunodeficiency virus [HIV] disease: Secondary | ICD-10-CM

## 2016-10-29 DIAGNOSIS — K582 Mixed irritable bowel syndrome: Secondary | ICD-10-CM

## 2016-10-29 NOTE — Progress Notes (Signed)
Patient ID: Bradley Mullen, male   DOB: [birth year], 30 y.o.   MRN: [id number]  HPI Bradley Mullen is a 30yo M with HIV disease, CD 4 count of 530/VL3,400 in October, previously on triumeq.  He stopped his triumeq in July/August and stretched out his medication since he was concerned about insurance issues. He also reports having a 40 days of fasting, including medication, he lost roughly 30 lbs, feeling better. He reports that his gi symptoms have subsided since stopping taking his hiv medication. Secondly, his mood is improved since being off of his meds. He subsequently lost his coverage for his medications for which he didn't inform the clinic.   Dx in 2011, genotype is wildtype, detectable up until August 2015. Started ART in June 2015, started on stribild. He switched to triumeq this past summer but still felt that his symptoms of constipation were due to triumeq  He reports that he doesn't think he can take more than one pill per day.   Outpatient Encounter Prescriptions as of 10/29/2016  Medication Sig  . TRIUMEQ 600-50-300 MG tablet TAKE 1 TABLET BY MOUTH DAILY. (Patient not taking: Reported on 10/29/2016)   No facility-administered encounter medications on file as of 10/29/2016.      Patient Active Problem List   Diagnosis Date Noted  . 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 PERSONAL HISTORY OF COLONIC POLYPS 11/14/2010  . HIV INFECTION 10/31/2010     Health Maintenance Due  Topic Date Due  . INFLUENZA VACCINE  07/17/2016     Review of Systems Review of Systems  Constitutional: Negative for fever, chills, diaphoresis, activity change, appetite change, fatigue and unexpected weight change.  HENT: Negative for congestion, sore throat, rhinorrhea, sneezing, trouble swallowing and sinus pressure.  Eyes: Negative for photophobia and visual disturbance.  Respiratory: Negative for cough, chest tightness, shortness of breath, wheezing and stridor.  Cardiovascular: Negative for  chest pain, palpitations and leg swelling.  Gastrointestinal: Negative for nausea, vomiting, abdominal pain, diarrhea, constipation, blood in stool, abdominal distention and anal bleeding.  Genitourinary: Negative for dysuria, hematuria, flank pain and difficulty urinating.  Musculoskeletal: Negative for myalgias, back pain, joint swelling, arthralgias and gait problem.  Skin: Negative for color change, pallor, rash and wound.  Neurological: Negative for dizziness, tremors, weakness and light-headedness.  Hematological: Negative for adenopathy. Does not bruise/bleed easily.  Psychiatric/Behavioral: Negative for behavioral problems, confusion, sleep disturbance, dysphoric mood, decreased concentration and agitation.    Physical Exam   BP (!) 153/80   Pulse 94   Temp 97.7 F (36.5 C) (Oral)   Ht 5' 10.5" (1.791 m)   Wt 165 lb (74.8 kg)   BMI 23.34 kg/m  Physical Exam  Constitutional: He is oriented to person, place, and time. He appears well-developed and well-nourished. No distress.  HENT:  Mouth/Throat: Oropharynx is clear and moist. No oropharyngeal exudate.  Cardiovascular: Normal rate, regular rhythm and normal heart sounds. Exam reveals no gallop and no friction rub.  No murmur heard.  Pulmonary/Chest: Effort normal and breath sounds normal. No respiratory distress. He has no wheezes.  Abdominal: Soft. Bowel sounds are normal. He exhibits no distension. There is no tenderness.  Lymphadenopathy:  He has no cervical adenopathy.  Neurological: He is alert and oriented to person, place, and time.  Skin: Skin is warm and dry. No rash noted. No erythema.  Psychiatric: He has a normal mood and affect. His behavior is normal.    Lab Results  Component Value Date   CD4TCELL 22 (  L) 10/15/2016   Lab Results  Component Value Date   CD4TABS 530 10/15/2016   CD4TABS 660 05/22/2016   CD4TABS 590 03/12/2016   Lab Results  Component Value Date   HIV1RNAQUANT 3,426 (H) 10/15/2016    Lab Results  Component Value Date   HEPBSAB POS (A) 10/31/2010   No results found for: RPR  CBC Lab Results  Component Value Date   WBC 4.9 10/15/2016   RBC 4.48 10/15/2016   HGB 12.7 (L) 10/15/2016   HCT 38.1 (L) 10/15/2016   PLT 221 10/15/2016   MCV 85.0 10/15/2016   MCH 28.3 10/15/2016   MCHC 33.3 10/15/2016   RDW 13.9 10/15/2016   LYMPHSABS 2.2 03/12/2016   MONOABS 0.4 03/12/2016   EOSABS 0.2 03/12/2016   BASOSABS 0.0 03/12/2016   BMET Lab Results  Component Value Date   NA 140 10/15/2016   K 4.1 10/15/2016   CL 104 10/15/2016   CO2 26 10/15/2016   GLUCOSE 86 10/15/2016   BUN 15 10/15/2016   CREATININE 1.02 10/15/2016   CALCIUM 9.6 10/15/2016   GFRNONAA >89 03/12/2016   GFRAA >89 03/12/2016     Assessment and Plan  hiv disease = ideally would like to restart on triumeq though I am concerned that he has now developed resistance with taking intermittent meds prior to stopping in July. We will check his genotype and integrase resistance. He is will to restart triumeq. Options are limited since he did not tolerate stribild.  Not sexually active = continue to abstain or use condoms especially when he has detectable viral load  Mood = improved, since stopping triumeq  Ibs/constipation = now improved  Hx of colonic polyps = will refer to gi at next visit  Health maintenance = defer flu shot

## 2016-11-01 LAB — HIV-1 RNA ULTRAQUANT REFLEX TO GENTYP+
HIV 1 RNA QUANT: 11968 {copies}/mL — AB (ref <20)
HIV-1 RNA Quant, Log: 4.08 Log copies/mL — ABNORMAL HIGH (ref <1.30)

## 2016-11-07 LAB — HIV-1 INTEGRASE GENOTYPE: Date Viral Load Collected: 11132017

## 2016-11-12 LAB — HIV-1 GENOTYPR PLUS

## 2016-11-21 ENCOUNTER — Telehealth: Payer: Self-pay | Admitting: *Deleted

## 2016-11-21 NOTE — Telephone Encounter (Signed)
Patient called to see if his labs have come back. Advised he is waiting to see if he will have a medication change. Advised him they are back but will have to have the doctor look at them and someone will give him a call back once she decides what she wants him to do. Advised will ask her and call back once she responds.

## 2016-12-02 NOTE — Telephone Encounter (Signed)
Can you call back patient that he can continue with triumeq. No need to change unless he is not tolerating his medicines

## 2017-01-25 ENCOUNTER — Ambulatory Visit (INDEPENDENT_AMBULATORY_CARE_PROVIDER_SITE_OTHER): Payer: BLUE CROSS/BLUE SHIELD | Admitting: Physician Assistant

## 2017-01-25 VITALS — BP 110/68 | HR 80 | Temp 98.8°F | Resp 17 | Ht 70.5 in | Wt 165.0 lb

## 2017-01-25 DIAGNOSIS — J069 Acute upper respiratory infection, unspecified: Secondary | ICD-10-CM | POA: Diagnosis not present

## 2017-01-25 DIAGNOSIS — B20 Human immunodeficiency virus [HIV] disease: Secondary | ICD-10-CM

## 2017-01-25 DIAGNOSIS — B9789 Other viral agents as the cause of diseases classified elsewhere: Secondary | ICD-10-CM | POA: Diagnosis not present

## 2017-01-25 MED ORDER — ABACAVIR-DOLUTEGRAVIR-LAMIVUD 600-50-300 MG PO TABS
1.0000 | ORAL_TABLET | Freq: Every day | ORAL | 0 refills | Status: DC
Start: 2017-01-25 — End: 2017-01-25

## 2017-01-25 MED ORDER — ABACAVIR-DOLUTEGRAVIR-LAMIVUD 600-50-300 MG PO TABS: 1.0000 | ORAL_TABLET | Freq: Every day | ORAL | 0 refills | Status: DC

## 2017-01-25 NOTE — Progress Notes (Signed)
01/29/2017 8:27 AM   DOB: Jun 04, 1986 / MRN: [id number]  SUBJECTIVE:  Bradley Mullen is a well appearing 31 y.o. male presenting for cough and chest congestion that started 7 days ago. He thinks this was the flu, as his friend who caught the same illness from him was diagnosed with flu by swab. He tells me he is improving and continues to feel better.  He is worried because he has a history of HIV.   He has a history of  HIV disease and a history of non compliance per his last ID note in 10/29/16.   He has No Known Allergies.   He  has a past medical history of HIV (human immunodeficiency virus infection) (HCC).    He  reports that he has never smoked. He has never used smokeless tobacco. He reports that he does not drink alcohol or use drugs. He  reports that he currently engages in sexual activity and has had male partners. The patient  has no past surgical history on file.  His family history is not on file.  Review of Systems  Constitutional: Negative for chills and fever.  HENT: Positive for sore throat.   Respiratory: Positive for cough. Negative for sputum production and shortness of breath.   Cardiovascular: Negative for chest pain and leg swelling.  Gastrointestinal: Negative for nausea.  Skin: Negative for rash.  Neurological: Negative for dizziness.    The problem list and medications were reviewed and updated by myself where necessary and exist elsewhere in the encounter.   OBJECTIVE:  BP 110/68 (BP Location: Right Arm, Patient Position: Sitting, Cuff Size: Normal)   Pulse 80   Temp 98.8 F (37.1 C) (Oral)   Resp 17   Ht 5' 10.5" (1.791 m)   Wt 165 lb (74.8 kg)   SpO2 99%   BMI 23.34 kg/m   Physical Exam  Constitutional: He is oriented to person, place, and time.  HENT:  Right Ear: Tympanic membrane normal.  Left Ear: Tympanic membrane normal.  Nose: Nose normal.  Mouth/Throat: Uvula is midline, oropharynx is clear and moist and mucous membranes are normal.   Cardiovascular: Normal rate, regular rhythm and normal heart sounds.  Exam reveals no gallop, no friction rub and no decreased pulses.   No murmur heard. Pulmonary/Chest: Effort normal and breath sounds normal. No respiratory distress. He has no decreased breath sounds. He has no wheezes. He has no rhonchi. He has no rales.  Musculoskeletal: He exhibits no edema.  Neurological: He is alert and oriented to person, place, and time.  Skin: Skin is warm and dry. He is not diaphoretic.    Lab Results  Component Value Date   CD4TCELL 22 (L) 10/15/2016   CD4TABS 530 10/15/2016     No results found for this or any previous visit (from the past 72 hour(s)).  No results found.  ASSESSMENT AND PLAN:  Bradley Mullen was seen today for uri and sore throat.  Diagnoses and all orders for this visit:  Viral URI Comments: He likely had the flu and the illness is resolving. Exam reassuring. Advised rest and and OTC preps of choice.   Human immunodeficiency virus (HIV) disease (HCC) Comments: Per Dr. Feliz BeamSnider's last note he should resume medication.  I will refill today for 30 days and he will call to make an appointment to be seen by ID. Orders: -     abacavir-dolutegravir-lamiVUDine (TRIUMEQ) 600-50-300 MG tablet; Take 1 tablet by mouth daily.  Other orders -  Discontinue: abacavir-dolutegravir-lamiVUDine (TRIUMEQ) 600-50-300 MG tablet; Take 1 tablet by mouth daily.    The patient is advised to call or return to clinic if he does not see an improvement in symptoms, or to seek the care of the closest emergency department if he worsens with the above plan.   Deliah Boston, MHS, PA-C [facility] 01/29/2017 8:27 AM

## 2017-01-25 NOTE — Patient Instructions (Signed)
     IF you received an x-ray today, you will receive an invoice from [facility]. Please contact [facility] at [phone] with questions or concerns regarding your invoice.   IF you received labwork today, you will receive an invoice from [facility]. Please contact [facility] at [phone] with questions or concerns regarding your invoice.   Our billing staff will not be able to assist you with questions regarding bills from these companies.  You will be contacted with the lab results as soon as they are available. The fastest way to get your results is to activate your My Chart account. Instructions are located on the last page of this paperwork. If you have not heard from us regarding the results in 2 weeks, please contact this office.     

## 2017-01-28 ENCOUNTER — Telehealth: Payer: Self-pay | Admitting: *Deleted

## 2017-01-28 NOTE — Telephone Encounter (Signed)
[facility] if [facility] patient's Triumeq; it was sent in by MD at Urgent Care. He had reported to Dr. Drue SecondSnider that he had stopped and started this medication with concerns of side effects. He was suppose to restart in 11/2017. Routed call to [facility] pharmacist to research further.

## 2017-01-29 ENCOUNTER — Telehealth: Payer: Self-pay | Admitting: Pharmacist

## 2017-01-29 NOTE — Telephone Encounter (Signed)
[facility] yesterday regarding Bradley Mullen and his Triumeq.  I also tried calling the patient twice this morning to see what his issues were with Triumeq. Left message and have not heard back yet.  Will call back tomorrow.

## 2017-01-30 ENCOUNTER — Telehealth: Payer: Self-pay | Admitting: Pharmacist

## 2017-01-30 ENCOUNTER — Other Ambulatory Visit: Payer: Self-pay | Admitting: Pharmacist

## 2017-01-30 DIAGNOSIS — B20 Human immunodeficiency virus [HIV] disease: Secondary | ICD-10-CM

## 2017-01-30 NOTE — Telephone Encounter (Signed)
Have continued to try and call Bradley Mullen with no success the last 3 days.  [facility] and [facility] [place]. Advised her that if she can get ahold of him to go ahead and send the 30 day supply the urgent care doctor sent in and I will continue to try and get him in the office to triage his side effects and issues.

## 2017-02-07 ENCOUNTER — Telehealth: Payer: Self-pay | Admitting: Pharmacist

## 2017-02-07 ENCOUNTER — Other Ambulatory Visit: Payer: Self-pay | Admitting: Pharmacist

## 2017-02-07 DIAGNOSIS — B20 Human immunodeficiency virus [HIV] disease: Secondary | ICD-10-CM

## 2017-02-07 MED ORDER — ABACAVIR-DOLUTEGRAVIR-LAMIVUD 600-50-300 MG PO TABS: 1.0000 | ORAL_TABLET | Freq: Every day | ORAL | 2 refills | Status: DC

## 2017-02-07 NOTE — Telephone Encounter (Signed)
Finally about to get a hold of Bradley Mullen. He will be [facility]  He requested to see Dr. Drue SecondSnider and not pharmacy so I made a f/u with her for 3/27 at 9:45am.

## 2017-03-12 ENCOUNTER — Ambulatory Visit: Payer: Self-pay | Admitting: Internal Medicine

## 2019-03-18 ENCOUNTER — Ambulatory Visit (INDEPENDENT_AMBULATORY_CARE_PROVIDER_SITE_OTHER): Payer: Self-pay

## 2019-03-18 ENCOUNTER — Ambulatory Visit
Admission: EM | Admit: 2019-03-18 | Discharge: 2019-03-18 | Disposition: A | Discharge status: Home / Self Care | Payer: Self-pay | Attending: Physician Assistant | Admitting: Physician Assistant

## 2019-03-18 DIAGNOSIS — B349 Viral infection, unspecified: Secondary | ICD-10-CM

## 2019-03-18 MED ORDER — FLUTICASONE PROPIONATE 50 MCG/ACT NA SUSP: 2.0000 | Freq: Every day | NASAL | 0 refills | Status: DC

## 2019-03-18 MED ORDER — IPRATROPIUM BROMIDE 0.06 % NA SOLN: 2.0000 | Freq: Four times a day (QID) | NASAL | 12 refills | Status: DC

## 2019-03-18 MED ORDER — ALBUTEROL SULFATE HFA 108 (90 BASE) MCG/ACT IN AERS
2.0000 | INHALATION_SPRAY | Freq: Once | RESPIRATORY_TRACT | Status: AC
  Administered 2019-03-18: 12:00:00 2 via RESPIRATORY_TRACT

## 2019-03-18 MED ORDER — BENZONATATE 100 MG PO CAPS: 100.0000 mg | ORAL_CAPSULE | Freq: Three times a day (TID) | ORAL | 0 refills | Status: DC

## 2019-03-18 NOTE — ED Provider Notes (Signed)
[facility]    CSN: [id number] Arrival date & time: 03/18/19  1135     History   Chief Complaint Chief Complaint  Patient presents with  . Nasal Congestion    HPI Bradley Mullen is a 33 y.o. male.   34 year old male HIV positive not on medication comes in for 3-day history of URI symptoms.  Has had rhinorrhea, nasal congestion, mild sneezing, cough.  He has had a fever with T-max of 101, responsive to antipyretic.  He feels slightly short of breath when laying down at night, otherwise without shortness of breath.  Has had some intermittent wheezing.  Denies chest pain, leg swelling, orthopnea.  Denies abdominal pain, nausea, vomiting, diarrhea.  Has been taking OTC cold medication with some relief.  He has not had any antipyretics in the last 8 hours.  Never smoker.  No obvious sick contact.  Patient states he is not on any medication for HIV, as he has not been able to tolerate any of the medications.  He has not had any blood work done for viral load or CD4 count in the past 2 years.     Past Medical History:  Diagnosis Date  . HIV (human immunodeficiency virus infection) [facility])     Patient Active Problem List   Diagnosis Date Noted  . 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 PERSONAL HISTORY OF COLONIC POLYPS 11/14/2010  . Human immunodeficiency virus (HIV) disease (HCC) 10/31/2010    History reviewed. No pertinent surgical history.     Home Medications    Prior to Admission medications   Medication Sig Start Date End Date Taking? Authorizing Provider  benzonatate (TESSALON) 100 MG capsule Take 1 capsule (100 mg total) by mouth every 8 (eight) hours. 03/18/19   Cathie Hoops, Britiny Defrain V, PA-C  fluticasone (FLONASE) 50 MCG/ACT nasal spray Place 2 sprays into both nostrils daily. 03/18/19   Cathie Hoops, Roselie Cirigliano V, PA-C  ipratropium (ATROVENT) 0.06 % nasal spray Place 2 sprays into both nostrils 4 (four) times daily. 03/18/19   Belinda Fisher, PA-C    Family History No family history on file.   Social History Social History   Tobacco Use  . Smoking status: Never Smoker  . Smokeless tobacco: Never Used  Substance Use Topics  . Alcohol use: No    Alcohol/week: 0.0 standard drinks  . Drug use: No     Allergies   Patient has no known allergies.   Review of Systems Review of Systems  Reason unable to perform ROS: See HPI as above.     Physical Exam Triage Vital Signs ED Triage Vitals [03/18/19 1149]  Enc Vitals Group     BP (!) 146/90     Pulse Rate 84     Resp 18     Temp 98.3 F (36.8 C)     Temp Source Oral     SpO2 97 %     Weight      Height      Head Circumference      Peak Flow      Pain Score 0     Pain Loc      Pain Edu?      Excl. in [place]?    No data found.  Updated Vital Signs BP (!) 146/90 (BP Location: Left Arm)   Pulse 84   Temp 98.3 F (36.8 C) (Oral)   Resp 18   SpO2 97%   Visual Acuity Right Eye Distance:   Left Eye Distance:  Bilateral Distance:    Right Eye Near:   Left Eye Near:    Bilateral Near:     Physical Exam Constitutional:      General: He is not in acute distress.    Appearance: He is well-developed. He is not ill-appearing, toxic-appearing or diaphoretic.  HENT:     Head: Normocephalic and atraumatic.     Right Ear: Tympanic membrane, ear canal and external ear normal. Tympanic membrane is not erythematous or bulging.     Left Ear: Tympanic membrane, ear canal and external ear normal. Tympanic membrane is not erythematous or bulging.     Nose: Nose normal.     Right Sinus: No maxillary sinus tenderness or frontal sinus tenderness.     Left Sinus: No maxillary sinus tenderness or frontal sinus tenderness.     Mouth/Throat:     Mouth: Mucous membranes are moist.     Pharynx: Oropharynx is clear. Uvula midline.  Eyes:     Conjunctiva/sclera: Conjunctivae normal.     Pupils: Pupils are equal, round, and reactive to light.  Neck:     Musculoskeletal: Normal range of motion and neck supple.  Cardiovascular:      Rate and Rhythm: Normal rate and regular rhythm.     Heart sounds: Normal heart sounds. No murmur. No friction rub. No gallop.   Pulmonary:     Effort: Pulmonary effort is normal. No accessory muscle usage, prolonged expiration, respiratory distress or retractions.     Comments: Patient able to speak in full sentences without difficulty.  He has diffuse expiratory wheezing throughout with mild rhonchi.  Good air movement. Skin:    General: Skin is warm and dry.  Neurological:     Mental Status: He is alert and oriented to person, place, and time.      UC Treatments / Results  Labs (all labs ordered are listed, but only abnormal results are displayed) Labs Reviewed - No data to display  EKG None  Radiology Dg Chest 2 View  Result Date: 03/18/2019 CLINICAL DATA:  Nasal and chest congestion, wheezing, productive cough w/ green spurum, and fever x 5 days, has gotten worse the last 2 days. HIV+ not on medication. No heart or lung conditions. Nonsmoker, nondiabetic. EXAM: CHEST - 2 VIEW COMPARISON:  None. FINDINGS: Cardiac silhouette is normal in size. Normal mediastinal and hilar contours. Lungs are clear.  No pleural effusion or pneumothorax. Skeletal structures are unremarkable. IMPRESSION: No active cardiopulmonary disease. Electronically Signed   By: Amie Portlandavid  Ormond M.D.   On: 03/18/2019 12:43    Procedures Procedures (including critical care time)  Medications Ordered in UC Medications  albuterol (PROVENTIL HFA;VENTOLIN HFA) 108 (90 Base) MCG/ACT inhaler 2 puff (2 puffs Inhalation Given 03/18/19 1215)    Initial Impression / Assessment and Plan / UC Course  I have reviewed the triage vital signs and the nursing notes.  Pertinent labs & imaging results that were available during my care of the patient were reviewed by me and considered in my medical decision making (see chart for details).    Given current COVID pandemic, will defer nebulizer treatment.  Albuterol inhaler  given.  Patient without significant improvement in symptoms, however, denies current shortness of breath.  Mild improvement of wheezing on exam, still with mild rhonchi diffusely.  Given patient immunocompromised, unknown viral load/CD4 count, will obtain chest x-ray for further evaluation needed.  Chest x-ray negative for active cardiopulmonary disease.  Patient stable at this time. Speaking in full sentences without  respiratory distress. Afebrile without antipyretic in last 8 hours. No tachycardia, tachypnea. COVID testing limited to inpatient. Given history and exam, will have patient self quarantine. Instructions on when to end quarantine, house member isolation discussed, and resources provided. Symptomatic treatment discussed. Return precautions given. Patient expresses understanding and agrees to plan.  Final Clinical Impressions(s) / UC Diagnoses   Final diagnoses:  Viral illness    ED Prescriptions    Medication Sig Dispense Auth. Provider   benzonatate (TESSALON) 100 MG capsule Take 1 capsule (100 mg total) by mouth every 8 (eight) hours. 21 capsule Darica Goren V, PA-C   fluticasone (FLONASE) 50 MCG/ACT nasal spray Place 2 sprays into both nostrils daily. 1 g Medhansh Brinkmeier V, PA-C   ipratropium (ATROVENT) 0.06 % nasal spray Place 2 sprays into both nostrils 4 (four) times daily. 15 mL Threasa Alpha, [place] 03/18/19 1308

## 2019-03-18 NOTE — Discharge Instructions (Addendum)
Chest xray negative for pneumonia. As discussed, cannot rule out COVID. Currently, no alarming signs. Start albuterol as needed. Tessalon for cough. Flonase, atrovent for nasal congestion/drainage. I would like you to quarantine for 7 days since symptoms onset AND >72 hours without fever, and improved respiratory symptoms. Continue to monitor, you can call COVID hotline [phone]) or use [facility] online if symptoms worsens to determine where you should seek care. If experiencing shortness of breath, trouble breathing, call 911 and provide them with your current situation.

## 2019-03-18 NOTE — ED Triage Notes (Signed)
Pt c/o nasal and chest congestion since Monday with wheezing and minor cough

## 2019-05-27 ENCOUNTER — Other Ambulatory Visit: Payer: Self-pay

## 2019-05-27 ENCOUNTER — Encounter: Payer: Self-pay | Admitting: Family Medicine

## 2019-05-27 ENCOUNTER — Ambulatory Visit
Admission: EM | Admit: 2019-05-27 | Discharge: 2019-05-27 | Disposition: A | Discharge status: Home / Self Care | Payer: Self-pay | Attending: Family Medicine | Admitting: Family Medicine

## 2019-05-27 DIAGNOSIS — R432 Parageusia: Secondary | ICD-10-CM

## 2019-05-27 DIAGNOSIS — H53413 Scotoma involving central area, bilateral: Secondary | ICD-10-CM

## 2019-05-27 DIAGNOSIS — R202 Paresthesia of skin: Secondary | ICD-10-CM

## 2019-05-27 NOTE — ED Triage Notes (Signed)
Pt c/o confusion and disoriented on Saturday, unable to get his words out. States on Monday had a smell of chlorine and his food taste funny. C/o headaches off and on also

## 2019-05-27 NOTE — ED Provider Notes (Signed)
[facility]    CSN: [id number] Arrival date & time: 05/27/19  1630     History   Chief Complaint Chief Complaint  Patient presents with  . Altered Mental Status    HPI Bradley Mullen is a 33 y.o. male.   Pt c/o confusion and disoriented on Saturday, unable to get his words out. States on Monday had a smell of chlorine and his food taste funny. C/o headaches off and on also  His symptoms began on Saturday, 4 days ago when he had paresthesias affecting his left arm all day.  This cleared and patient had trouble finding words on Sunday night.  He went to bed and seemed to be better Monday.  He lost the taste for some foods on Monday  Yesterday, he went to work experiencing the smell of bleach all day even though he was not exposed to this.  He has had migraines in the past, but this is not like them.  He has had a dull mild headache on the top of his head the last couple days.    No cough, fever, diarrhea, abdominal pain or loss of sense of smell.  Patient has a h/o HIV but stopped medication for it in 2017 when he experienced side effects.  No treatment since.  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  Rather, he drives by to check on things.  Established patient.     Past Medical History:  Diagnosis Date  . HIV (human immunodeficiency virus infection) [facility])     Patient Active Problem List   Diagnosis Date Noted  . 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 PERSONAL HISTORY OF COLONIC POLYPS 11/14/2010  . Human immunodeficiency virus (HIV) disease ([place]) 10/31/2010    History reviewed. No pertinent surgical history.     Home Medications    Prior to Admission medications   Not on File    Family History No family history on file.  Social History Social History   Tobacco Use  . Smoking status: Never Smoker  . Smokeless tobacco: Never Used  Substance Use Topics  . Alcohol use: No    Alcohol/week: 0.0 standard drinks  .  Drug use: No     Allergies   Patient has no known allergies.   Review of Systems Review of Systems  Neurological: Positive for speech difficulty, numbness and headaches.  All other systems reviewed and are negative.    Physical Exam Triage Vital Signs ED Triage Vitals  Enc Vitals Group     BP      Pulse      Resp      Temp      Temp src      SpO2      Weight      Height      Head Circumference      Peak Flow      Pain Score      Pain Loc      Pain Edu?      Excl. in [place]?    No data found.  Updated Vital Signs BP (!) 168/96 (BP Location: Left Arm)   Pulse 87   Temp 98.8 F (37.1 C) (Oral)   Resp 18   SpO2 98%    Physical Exam Vitals signs and nursing note reviewed.  Constitutional:      General: He is not in acute distress.    Appearance: Normal appearance. He is normal weight. He is  not ill-appearing or toxic-appearing.  HENT:     Head: Normocephalic.     Right Ear: Tympanic membrane and external ear normal.     Left Ear: Tympanic membrane and external ear normal.     Nose: Nose normal.     Mouth/Throat:     Pharynx: Oropharynx is clear.  Eyes:     Extraocular Movements: Extraocular movements intact.     Conjunctiva/sclera: Conjunctivae normal.     Pupils: Pupils are equal, round, and reactive to light.  Neck:     Musculoskeletal: Normal range of motion and neck supple.  Cardiovascular:     Rate and Rhythm: Normal rate.  Pulmonary:     Breath sounds: Normal breath sounds.  Musculoskeletal: Normal range of motion.  Skin:    General: Skin is warm and dry.  Neurological:     General: No focal deficit present.     Mental Status: He is alert.  Psychiatric:        Mood and Affect: Mood normal.        Behavior: Behavior normal.        Thought Content: Thought content normal.      UC Treatments / Results  Labs (all labs ordered are listed, but only abnormal results are displayed) Labs Reviewed  CBC WITH DIFFERENTIAL/PLATELET  COMPREHENSIVE  METABOLIC PANEL  TSH  HIV-1 RNA QUANT-NO REFLEX-BLD    EKG None  Radiology No results found.  Procedures Procedures (including critical care time)  Medications Ordered in UC Medications - No data to display  Initial Impression / Assessment and Plan / UC Course  I have reviewed the triage vital signs and the nursing notes.  Pertinent labs & imaging results that were available during my care of the patient were reviewed by me and considered in my medical decision making (see chart for details).    Final Clinical Impressions(s) / UC Diagnoses   Final diagnoses:  Paresthesias  Visual field scotoma of both eyes  Dysgeusia     Discharge Instructions     These symptoms suggest a disturbance in the central nervous system.  This can represent a different type of migraine or chemical imbalance or something related to the past HIV testing.  At this point, we are running several important tests which hopefully will be available tomorrow.    I would like you to schedule an appointment with Dr. Everlena CooperJaffe tomorrow when they open.    ED Prescriptions    None     Controlled Substance Prescriptions [place] Controlled Substance Registry consulted? Not Applicable   Elvina SidleLauenstein, [place]aui Britten, MD 05/29/19 1352

## 2019-05-27 NOTE — Discharge Instructions (Addendum)
These symptoms suggest a disturbance in the central nervous system.  This can represent a different type of migraine or chemical imbalance or something related to the past HIV testing.  At this point, we are running several important tests which hopefully will be available tomorrow.    I would like you to schedule an appointment with Dr. Tomi Likens tomorrow when they open.

## 2019-05-28 ENCOUNTER — Telehealth (HOSPITAL_COMMUNITY): Payer: Self-pay | Admitting: Emergency Medicine

## 2019-05-28 LAB — COMPREHENSIVE METABOLIC PANEL
ALT: 19 IU/L (ref 0–44)
AST: 20 IU/L (ref 0–40)
Albumin/Globulin Ratio: 1.3 (ref 1.2–2.2)
Albumin: 4.6 g/dL (ref 4.0–5.0)
Alkaline Phosphatase: 42 IU/L (ref 39–117)
BUN/Creatinine Ratio: 12 (ref 9–20)
BUN: 13 mg/dL (ref 6–20)
Bilirubin Total: <0.2 mg/dL (ref 0.0–1.2)
CO2: 23 mmol/L (ref 20–29)
Calcium: 8.8 mg/dL (ref 8.7–10.2)
Chloride: 105 mmol/L (ref 96–106)
Creatinine, Ser: 1.05 mg/dL (ref 0.76–1.27)
GFR calc Af Amer: 107 mL/min/{1.73_m2} (ref >59)
GFR calc non Af Amer: 92 mL/min/{1.73_m2} (ref >59)
Globulin, Total: 3.5 g/dL (ref 1.5–4.5)
Glucose: 115 mg/dL — ABNORMAL HIGH (ref 65–99)
Potassium: 4 mmol/L (ref 3.5–5.2)
Sodium: 143 mmol/L (ref 134–144)
Total Protein: 8.1 g/dL (ref 6.0–8.5)

## 2019-05-28 LAB — CBC WITH DIFFERENTIAL/PLATELET
Basophils Absolute: 0 10*3/uL (ref 0.0–0.2)
Basos: 1 %
EOS (ABSOLUTE): 0 10*3/uL (ref 0.0–0.4)
Eos: 1 %
Hematocrit: 38.3 % (ref 37.5–51.0)
Hemoglobin: 12.7 g/dL — ABNORMAL LOW (ref 13.0–17.7)
Immature Grans (Abs): 0 10*3/uL (ref 0.0–0.1)
Immature Granulocytes: 0 %
Lymphocytes Absolute: 2.3 10*3/uL (ref 0.7–3.1)
Lymphs: 62 %
MCH: 27 pg (ref 26.6–33.0)
MCHC: 33.2 g/dL (ref 31.5–35.7)
MCV: 81 fL (ref 79–97)
Monocytes Absolute: 0.3 10*3/uL (ref 0.1–0.9)
Monocytes: 7 %
Neutrophils Absolute: 1.1 10*3/uL — ABNORMAL LOW (ref 1.4–7.0)
Neutrophils: 29 %
Platelets: 172 10*3/uL (ref 150–450)
RBC: 4.71 x10E6/uL (ref 4.14–5.80)
RDW: 13.2 % (ref 11.6–15.4)
WBC: 3.6 10*3/uL (ref 3.4–10.8)

## 2019-05-28 LAB — TSH: TSH: 1.38 u[IU]/mL (ref 0.450–4.500)

## 2019-05-28 NOTE — Telephone Encounter (Signed)
Patient contacted and made aware of all results, all questions answered.   

## 2019-05-29 LAB — HIV-1 RNA QUANT-NO REFLEX-BLD
HIV-1 RNA Viral Load Log: 4.344 log10copy/mL
HIV-1 RNA Viral Load: 22100 copies/mL

## 2019-06-01 ENCOUNTER — Telehealth (HOSPITAL_COMMUNITY): Payer: Self-pay | Admitting: Emergency Medicine

## 2019-06-01 NOTE — Telephone Encounter (Signed)
Spoke with patient on the phone, he will contact ID as soon as possible for follow up.

## 2019-06-01 NOTE — Telephone Encounter (Signed)
Dr. Lanny Cramp reviewed chart, pt has not been on medicines for HIV since 2017. Pt needs follow up with ID. Attempted to reach patient. No answer at this time. Voicemail left.

## 2024-02-04 ENCOUNTER — Ambulatory Visit: Payer: Self-pay | Admitting: Podiatry

## 2024-02-04 ENCOUNTER — Encounter: Payer: Self-pay | Admitting: Podiatry

## 2024-02-04 DIAGNOSIS — L600 Ingrowing nail: Secondary | ICD-10-CM

## 2024-02-04 MED ORDER — NEOMYCIN-POLYMYXIN-HC 1 % OT SOLN: OTIC | 1 refills | Status: AC

## 2024-02-04 NOTE — Patient Instructions (Signed)

## 2024-02-05 NOTE — Progress Notes (Signed)
  Subjective:  Patient ID: Bradley Mullen, male    DOB: Apr 06, 1986,  MRN: [id number] HPI Chief Complaint  Patient presents with   Toe Pain    Hallux right - medial border, ingrown, tried getting pedicures to prevent, but no better   New Patient (Initial Visit)    38 y.o. male presents with the above complaint.   ROS: Denies fever chills nausea vomiting muscle aches pains calf pain back pain chest pain shortness of breath.  Past Medical History:  Diagnosis Date   HIV (human immunodeficiency virus infection) (HCC)    No past surgical history on file.  Current Outpatient Medications:    NEOMYCIN-POLYMYXIN-HYDROCORTISONE (CORTISPORIN) 1 % SOLN OTIC solution, Apply 1-2 drops to toe BID after soaking, Disp: 10 mL, Rfl: 1  No Known Allergies Review of Systems Objective:  There were no vitals filed for this visit.  General: Well developed, nourished, in no acute distress, alert and oriented x3   Dermatological: Skin is warm, dry and supple bilateral. Nails x 10 are well maintained; remaining integument appears unremarkable at this time. There are no open sores, no preulcerative lesions, no rash or signs of infection present.  Sharp incurvated nail margin along the tibial border of the hallux right.  Mild erythema no purulence no malodor there appears to be a small deformity in the nail plate  Vascular: Dorsalis Pedis artery and Posterior Tibial artery pedal pulses are 2/4 bilateral with immedate capillary fill time. Pedal hair growth present. No varicosities and no lower extremity edema present bilateral.   Neruologic: Grossly intact via light touch bilateral. Vibratory intact via tuning fork bilateral. Protective threshold with Semmes Wienstein monofilament intact to all pedal sites bilateral. Patellar and Achilles deep tendon reflexes 2+ bilateral. No Babinski or clonus noted bilateral.   Musculoskeletal: No gross boney pedal deformities bilateral. No pain, crepitus, or limitation noted  with foot and ankle range of motion bilateral. Muscular strength 5/5 in all groups tested bilateral.  Gait: Unassisted, Nonantalgic.    Radiographs:  None taken  Assessment & Plan:   Assessment: Ingrown toenail tibial border hallux right  Plan: Chemical matricectomy was performed today he tolerated procedure well without complications.  Was given both oral written home-going instructions for the care and soaking of the toe.  Will follow-up with him in a couple of weeks.  He was also provided with a prescription for Cortisporin Otic to be applied twice daily.     Marlin Jarrard T. Day, [place]

## 2024-02-20 ENCOUNTER — Encounter (INDEPENDENT_AMBULATORY_CARE_PROVIDER_SITE_OTHER): Payer: Self-pay | Admitting: Podiatry

## 2024-02-20 NOTE — Progress Notes (Signed)
 Patient left without being seen. Arrived at 10:41 for 11:15 appointment. I went to room him at 11:31 and he had already left.  This encounter was created in error - please disregard.

## 2024-02-27 ENCOUNTER — Ambulatory Visit (INDEPENDENT_AMBULATORY_CARE_PROVIDER_SITE_OTHER): Payer: Self-pay | Admitting: Podiatry

## 2024-02-27 ENCOUNTER — Encounter: Payer: Self-pay | Admitting: Podiatry

## 2024-02-27 DIAGNOSIS — L03031 Cellulitis of right toe: Secondary | ICD-10-CM

## 2024-02-27 MED ORDER — SULFAMETHOXAZOLE-TRIMETHOPRIM 800-160 MG PO TABS: 1.0000 | ORAL_TABLET | Freq: Two times a day (BID) | ORAL | 0 refills | Status: AC

## 2024-02-27 NOTE — Progress Notes (Signed)
 He presents today for nail check hallux right states that he was doing fine until he went to the pedicure salon and now it looks blistered and dark.  He is referring to his right hallux or we performed a chemical matricectomy to the tibial border.  Objective: Vital signs are stable he is alert and oriented x 3.  Pulses are palpable.  He does demonstrate a paronychia with loss of eponychia or cuticle.  The proximal nail fold is darker in appearance and does demonstrate some swelling.  Assessment: Paronychia hallux right  Plan: I recommended highly that he soak in Epsom salts and warm water daily also recommend he continue the Cortisporin otic.  And I started him on Bactrim.  Follow-up with him in 2 weeks if necessary

## 2024-03-10 ENCOUNTER — Other Ambulatory Visit: Payer: Self-pay | Admitting: Podiatry

## 2024-09-09 ENCOUNTER — Other Ambulatory Visit: Payer: Self-pay

## 2024-09-09 ENCOUNTER — Emergency Department (HOSPITAL_BASED_OUTPATIENT_CLINIC_OR_DEPARTMENT_OTHER): Payer: Self-pay

## 2024-09-09 ENCOUNTER — Emergency Department (HOSPITAL_BASED_OUTPATIENT_CLINIC_OR_DEPARTMENT_OTHER)
Admission: EM | Admit: 2024-09-09 | Discharge: 2024-09-09 | Disposition: A | Discharge status: Home / Self Care | Payer: Self-pay | Attending: Emergency Medicine | Admitting: Emergency Medicine

## 2024-09-09 ENCOUNTER — Ambulatory Visit
Admission: EM | Admit: 2024-09-09 | Discharge: 2024-09-09 | Disposition: A | Discharge status: Home / Self Care | Payer: Self-pay | Attending: Family Medicine | Admitting: Family Medicine

## 2024-09-09 ENCOUNTER — Ambulatory Visit (INDEPENDENT_AMBULATORY_CARE_PROVIDER_SITE_OTHER): Payer: Self-pay

## 2024-09-09 ENCOUNTER — Ambulatory Visit: Payer: Self-pay | Admitting: Nurse Practitioner

## 2024-09-09 ENCOUNTER — Encounter (HOSPITAL_BASED_OUTPATIENT_CLINIC_OR_DEPARTMENT_OTHER): Payer: Self-pay | Admitting: Emergency Medicine

## 2024-09-09 DIAGNOSIS — R079 Chest pain, unspecified: Secondary | ICD-10-CM

## 2024-09-09 DIAGNOSIS — Z21 Asymptomatic human immunodeficiency virus [HIV] infection status: Secondary | ICD-10-CM | POA: Insufficient documentation

## 2024-09-09 DIAGNOSIS — R9431 Abnormal electrocardiogram [ECG] [EKG]: Secondary | ICD-10-CM

## 2024-09-09 DIAGNOSIS — R0789 Other chest pain: Secondary | ICD-10-CM | POA: Insufficient documentation

## 2024-09-09 LAB — BASIC METABOLIC PANEL WITH GFR
Anion gap: 10 (ref 5–15)
BUN: 10 mg/dL (ref 6–20)
CO2: 26 mmol/L (ref 22–32)
Calcium: 8.9 mg/dL (ref 8.9–10.3)
Chloride: 104 mmol/L (ref 98–111)
Creatinine, Ser: 1.05 mg/dL (ref 0.61–1.24)
GFR, Estimated: >60 mL/min (ref >60)
Glucose, Bld: 105 mg/dL — ABNORMAL HIGH (ref 70–99)
Potassium: 4 mmol/L (ref 3.5–5.1)
Sodium: 140 mmol/L (ref 135–145)

## 2024-09-09 LAB — CBC WITH DIFFERENTIAL/PLATELET
Abs Immature Granulocytes: 0.02 K/uL (ref 0.00–0.07)
Basophils Absolute: 0 K/uL (ref 0.0–0.1)
Basophils Relative: 0 %
Eosinophils Absolute: 1.1 K/uL — ABNORMAL HIGH (ref 0.0–0.5)
Eosinophils Relative: 19 %
HCT: 33.9 % — ABNORMAL LOW (ref 39.0–52.0)
Hemoglobin: 11 g/dL — ABNORMAL LOW (ref 13.0–17.0)
Immature Granulocytes: 0 %
Lymphocytes Relative: 30 %
Lymphs Abs: 1.7 K/uL (ref 0.7–4.0)
MCH: 27.5 pg (ref 26.0–34.0)
MCHC: 32.4 g/dL (ref 30.0–36.0)
MCV: 84.8 fL (ref 80.0–100.0)
Monocytes Absolute: 0.3 K/uL (ref 0.1–1.0)
Monocytes Relative: 6 %
Neutro Abs: 2.6 K/uL (ref 1.7–7.7)
Neutrophils Relative %: 45 %
Platelets: 188 K/uL (ref 150–400)
RBC: 4 MIL/uL — ABNORMAL LOW (ref 4.22–5.81)
RDW: 13.2 % (ref 11.5–15.5)
WBC: 5.6 K/uL (ref 4.0–10.5)
nRBC: 0 % (ref 0.0–0.2)

## 2024-09-09 LAB — TROPONIN T, HIGH SENSITIVITY
Troponin T High Sensitivity: <15 ng/L (ref 0–19)
Troponin T High Sensitivity: <15 ng/L (ref 0–19)

## 2024-09-09 LAB — HEPATIC FUNCTION PANEL
ALT: 40 U/L (ref 0–44)
AST: 35 U/L (ref 15–41)
Albumin: 4.5 g/dL (ref 3.5–5.0)
Alkaline Phosphatase: 65 U/L (ref 38–126)
Bilirubin, Direct: 0.2 mg/dL (ref 0.0–0.2)
Indirect Bilirubin: 0.2 mg/dL — ABNORMAL LOW (ref 0.3–0.9)
Total Bilirubin: 0.3 mg/dL (ref 0.0–1.2)
Total Protein: 7.5 g/dL (ref 6.5–8.1)

## 2024-09-09 LAB — LIPASE, BLOOD: Lipase: 29 U/L (ref 11–51)

## 2024-09-09 MED ORDER — ALUM & MAG HYDROXIDE-SIMETH 200-200-20 MG/5ML PO SUSP
30.0000 mL | Freq: Once | ORAL | Status: AC
  Administered 2024-09-09: 30 mL via ORAL
  Filled 2024-09-09: qty 30

## 2024-09-09 MED ORDER — KETOROLAC TROMETHAMINE 15 MG/ML IJ SOLN
15.0000 mg | Freq: Once | INTRAMUSCULAR | Status: AC
  Administered 2024-09-09: 15 mg via INTRAVENOUS
  Filled 2024-09-09: qty 1

## 2024-09-09 MED ORDER — FAMOTIDINE 20 MG PO TABS
20.0000 mg | ORAL_TABLET | Freq: Once | ORAL | Status: AC
  Administered 2024-09-09: 20 mg via ORAL
  Filled 2024-09-09: qty 1

## 2024-09-09 NOTE — ED Provider Notes (Signed)
 [facility] Provider Note   CSN: [id number] Arrival date & time: 09/09/24  1916     Patient presents with: Chest Pain   Bradley Mullen is a 38 y.o. male.    Chest Pain   38 year old male presents emergency department with chest pain.  Reports just right of middle chest pain that began when he was sitting down.  States he just finished eating when his chest pain began.  This occurred around 2 PM this afternoon.  States that he did feel slightly short of breath when it happened.  States the pain has been constant since onset.  States that he feels like it was related to reflux but took no medication for this.  Went to the urgent care and was sent to the emergency department for further assessment.  No known family history of cardiac or pulmonary complications.  Patient denies any fevers, chills, cough, abdominal pain, nausea, vomiting.  Past medical history significant for HIV  Prior to Admission medications   Medication Sig Start Date End Date Taking? Authorizing Provider  NEOMYCIN -POLYMYXIN-HYDROCORTISONE (CORTISPORIN) 1 % SOLN OTIC solution Apply 1-2 drops to toe BID after soaking 02/04/24   Hyatt, Max T, DPM  sulfamethoxazole -trimethoprim  (BACTRIM  DS) 800-160 MG tablet Take 1 tablet by mouth 2 (two) times daily. 02/27/24   Hyatt, Max T, DPM    Allergies: Patient has no known allergies.    Review of Systems  Cardiovascular:  Positive for chest pain.  All other systems reviewed and are negative.   Updated Vital Signs BP 139/75   Pulse 93   Temp 99.7 F (37.6 C)   Resp (!) 22   Ht 5' 10.5 (1.791 m)   Wt 72.1 kg   SpO2 98%   BMI 22.49 kg/m   Physical Exam Vitals and nursing note reviewed.  Constitutional:      General: He is not in acute distress.    Appearance: He is well-developed.  HENT:     Head: Normocephalic and atraumatic.  Eyes:     Conjunctiva/sclera: Conjunctivae normal.  Cardiovascular:     Rate and Rhythm:  Normal rate and regular rhythm.     Pulses: Normal pulses.     Heart sounds: No murmur heard. Pulmonary:     Effort: Pulmonary effort is normal. No respiratory distress.     Breath sounds: Normal breath sounds. No wheezing, rhonchi or rales.  Abdominal:     Palpations: Abdomen is soft.     Tenderness: There is no abdominal tenderness.  Musculoskeletal:        General: No swelling.     Cervical back: Neck supple.     Right lower leg: No edema.     Left lower leg: No edema.  Skin:    General: Skin is warm and dry.     Capillary Refill: Capillary refill takes less than 2 seconds.  Neurological:     Mental Status: He is alert.  Psychiatric:        Mood and Affect: Mood normal.     (all labs ordered are listed, but only abnormal results are displayed) Labs Reviewed  BASIC METABOLIC PANEL WITH GFR - Abnormal; Notable for the following components:      Result Value   Glucose, Bld 105 (*)    All other components within normal limits  CBC WITH DIFFERENTIAL/PLATELET - Abnormal; Notable for the following components:   RBC 4.00 (*)    Hemoglobin 11.0 (*)    HCT 33.9 (*)  Eosinophils Absolute 1.1 (*)    All other components within normal limits  HEPATIC FUNCTION PANEL - Abnormal; Notable for the following components:   Indirect Bilirubin 0.2 (*)    All other components within normal limits  LIPASE, BLOOD  TROPONIN T, HIGH SENSITIVITY  TROPONIN T, HIGH SENSITIVITY    EKG: None  Radiology: DG Chest 2 View Result Date: 09/09/2024 EXAM: 2 VIEW(S) XRAY OF THE CHEST 09/09/2024 08:05:39 PM COMPARISON: 09/09/2024 CLINICAL HISTORY: cp. Chest pain today after lunch, some dizziness and SOB, family hx of heart problems FINDINGS: LUNGS AND PLEURA: No focal pulmonary opacity. No pulmonary edema. No pleural effusion. No pneumothorax. HEART AND MEDIASTINUM: No acute abnormality of the cardiac and mediastinal silhouettes. BONES AND SOFT TISSUES: No acute osseous abnormality. IMPRESSION: 1. No  acute abnormalities. Electronically signed by: Pinkie Pebbles MD 09/09/2024 08:15 PM EDT RP Workstation: [id number]   DG Chest 2 View Result Date: 09/09/2024 CLINICAL DATA:  right sided chest pain x today EXAM: CHEST - 2 VIEW COMPARISON:  03/18/2019 FINDINGS: No focal airspace consolidation, pleural effusion, or pneumothorax. No cardiomegaly.No acute fracture or destructive lesion. IMPRESSION: No acute cardiopulmonary abnormality. Electronically Signed   By: Rogelia Myers M.D.   On: 09/09/2024 18:17     Procedures   Medications Ordered in the ED  famotidine  (PEPCID ) tablet 20 mg (20 mg Oral Given 09/09/24 2129)  alum & mag hydroxide-simeth (MAALOX/MYLANTA) 200-200-20 MG/5ML suspension 30 mL (30 mLs Oral Given 09/09/24 2129)  ketorolac  (TORADOL ) 15 MG/ML injection 15 mg (15 mg Intravenous Given 09/09/24 2241)                                    Medical Decision Making Amount and/or Complexity of Data Reviewed Labs: ordered. Radiology: ordered.  Risk OTC drugs. Prescription drug management.   This patient presents to the ED for concern of chest pain, this involves an extensive number of treatment options, and is a complaint that carries with it a high risk of complications and morbidity.  The differential diagnosis includes ACS, PE, pneumothorax, GERD, anxiety, pericarditis/myocarditis/tamponade, pneumonia, other   Co morbidities that complicate the patient evaluation  See HPI   Additional history obtained:  Additional history obtained from EMR External records from outside source obtained and reviewed including hospital records   Lab Tests:  I Ordered, and personally interpreted labs.  The pertinent results include: No leukocytosis.  Anemia hemoglobin 11.0.  Platelets within range.  No Electra abnormalities.  No renal dysfunction.  Initial troponin of less than 15 with repeat less than 15.  No transaminitis.  Lipase within normal limits.   Imaging Studies ordered:  I  ordered imaging studies including chest x-ray I independently visualized and interpreted imaging which showed no acute cardiopulmonary abnormality I agree with the radiologist interpretation   Cardiac Monitoring: / EKG:  The patient was maintained on a cardiac monitor.  I personally viewed and interpreted the cardiac monitored which showed an underlying rhythm of: Sinus rhythm   Consultations Obtained:  N/a   Problem List / ED Course / Critical interventions / Medication management  Chest pain I ordered medication including Maalox, Pepcid , Toradol    Reevaluation of the patient after these medicines showed that the patient improved I have reviewed the patients home medicines and have made adjustments as needed   Social Determinants of Health:  Denies tobacco or illicit drug use.   Test / Admission - Considered:  Chest  pain Vitals signs within normal range and stable throughout visit. Laboratory/imaging studies significant for: See above  38 year old male presents emergency department with chest pain.  Reports just right of middle chest pain that began when he was sitting down.  States he just finished eating when his chest pain began.  This occurred around 2 PM this afternoon.  States that he did feel slightly short of breath when it happened.  States the pain has been constant since onset.  States that he feels like it was related to reflux but took no medication for this.  Went to the urgent care and was sent to the emergency department for further assessment.  No known family history of cardiac or pulmonary complications.  Patient denies any fevers, chills, cough, abdominal pain, nausea, vomiting. On exam, no abdominal tenderness.  Lungs clear to auscultation bilaterally.  No clear auscultatory murmur.  Workup today reassuring.  Delta negative troponin, nonspecific changes on EKG; low suspicion for ACS.  Patient Wells PE score 0 and PERC negative; low suspicion for PE.  Patient  chest x-ray without obvious pneumonia, pneumothorax or other acute cardiopulmonary abnormality.  Treated with medications in emergency department and did note improvement.  Could be secondary to GI source.  Will recommend follow-up with primary care in the outpatient setting for reassessment.  Treatment plan discussed with patient he was understanding was agreeable.  Patient well-appearing, afebrile in no acute distress upon discharge. Worrisome signs and symptoms were discussed with the patient, and the patient acknowledged understanding to return to the ED if noticed. Patient was stable upon discharge.       Final diagnoses:  None    ED Discharge Orders     None          Silver Wonda LABOR, [place] 09/09/24 2252    Lenor Hollering, MD 09/09/24 2316

## 2024-09-09 NOTE — ED Triage Notes (Addendum)
 Pt present with rt side chest discomfort that has been worsening. Pt states he feels SOB and states the chest pain has not been an issue until now. States he ate fish, fried chicken and mac n cheese. Reports the pain started worsening after eating. States belching does not help.

## 2024-09-09 NOTE — ED Provider Notes (Signed)
 [facility]    CSN: [id number] Arrival date & time: 09/09/24  1703      History   Chief Complaint Chief Complaint  Patient presents with   Chest Pain   Shortness of Breath   Chills    HPI Bradley Mullen is a 38 y.o. male presents for chest pain.  Patient has a past medical history of HIV.  Patient states around 2:00 today he developed a right sided chest pain that is a dull ache and becomes sharp with inspiration that has been constant since it began.  He states it does radiate through towards his back around his shoulder blade.  He states he went to the gym thinking he needed just to work it off but had shortness of breath prompting him to come in.  He denies any nausea/vomiting, dizziness, palpitations, syncope.  No known injury or inciting event.  No history of asthma or smoking.  States she has had similar symptoms in the past that have resolved quickly but these are lingering.  No cough or cold symptoms.  Denies any history of CAD with mother father or siblings.  He states symptoms are worse with deep inspiration and he cannot identify any alleviating factors.  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 No pertinent surgical history.     Home Medications    Prior to Admission medications   Medication Sig Start Date End Date Taking? Authorizing Provider  NEOMYCIN -POLYMYXIN-HYDROCORTISONE (CORTISPORIN) 1 % SOLN OTIC solution Apply 1-2 drops to toe BID after soaking 02/04/24   Hyatt, Max T, DPM  sulfamethoxazole -trimethoprim  (BACTRIM  DS) 800-160 MG tablet Take  1 tablet by mouth 2 (two) times daily. 02/27/24   Hyatt, Royden DASEN, DPM    Family History History reviewed. No pertinent family history.  Social History Social History   Tobacco Use   Smoking status: Never   Smokeless tobacco: Never  Substance Use Topics   Alcohol use: No    Alcohol/week: 0.0 standard drinks of alcohol   Drug use: No     Allergies   Patient has no known allergies.   Review of Systems Review of Systems  Respiratory:  Positive for shortness of breath.   Cardiovascular:  Positive for chest pain.     Physical Exam Triage Vital Signs ED Triage Vitals  Encounter Vitals Group     BP 09/09/24 1716 (!) 140/86     Girls Systolic BP Percentile --      Girls Diastolic BP Percentile --      Boys Systolic BP Percentile --      Boys Diastolic BP Percentile --      Pulse Rate 09/09/24 1716 79     Resp 09/09/24 1716 16     Temp 09/09/24 1716 99.2 F (37.3 C)     Temp Source 09/09/24 1716 Oral     SpO2 09/09/24 1716 98 %     Weight --      Height --      Head Circumference --      Peak Flow --  Pain Score 09/09/24 1714 8     Pain Loc --      Pain Education --      Exclude from Growth Chart --    No data found.  Updated Vital Signs BP (!) 140/86 (BP Location: Right Arm)   Pulse 79   Temp 99.2 F (37.3 C) (Oral)   Resp 16   SpO2 98%   Visual Acuity Right Eye Distance:   Left Eye Distance:   Bilateral Distance:    Right Eye Near:   Left Eye Near:    Bilateral Near:     Physical Exam Vitals and nursing note reviewed.  Constitutional:      General: He is not in acute distress.    Appearance: Normal appearance. He is not ill-appearing.  HENT:     Head: Normocephalic and atraumatic.  Eyes:     Pupils: Pupils are equal, round, and reactive to light.  Cardiovascular:     Rate and Rhythm: Normal rate and regular rhythm.     Heart sounds: Normal heart sounds.  Pulmonary:     Effort: Pulmonary effort is normal.     Breath sounds: Normal breath  sounds.  Chest:     Chest wall: Tenderness present. No swelling.       Comments: Patient states pain radiates through his chest to his right shoulder blade but there is no tenderness with palpation to the right shoulder blade Skin:    General: Skin is warm and dry.  Neurological:     General: No focal deficit present.     Mental Status: He is alert and oriented to person, place, and time.  Psychiatric:        Mood and Affect: Mood normal.        Behavior: Behavior normal.      UC Treatments / Results  Labs (all labs ordered are listed, but only abnormal results are displayed) Labs Reviewed - No data to display  EKG   Radiology DG Chest 2 View Result Date: 09/09/2024 CLINICAL DATA:  right sided chest pain x today EXAM: CHEST - 2 VIEW COMPARISON:  03/18/2019 FINDINGS: No focal airspace consolidation, pleural effusion, or pneumothorax. No cardiomegaly.No acute fracture or destructive lesion. IMPRESSION: No acute cardiopulmonary abnormality. Electronically Signed   By: Rogelia Myers M.D.   On: 09/09/2024 18:17    Procedures ED EKG  Date/Time: 09/09/2024 6:07 PM  Performed by: Loreda Myla SAUNDERS, NP Authorized by: Loreda Myla SAUNDERS, NP   ECG interpreted by ED Physician in the absence of a cardiologist: no   Previous ECG:    Previous ECG:  Unavailable Rate:    ECG rate:  72   ECG rate assessment: normal   Rhythm:    Rhythm: sinus rhythm   Ectopy:    Ectopy: none   QRS:    QRS axis:  Normal ST segments:    ST segments:  Non-specific T waves:    T waves: non-specific    (including critical care time)  Medications Ordered in UC Medications - No data to display  Initial Impression / Assessment and Plan / UC Course  I have reviewed the triage vital signs and the nursing notes.  Pertinent labs & imaging results that were available during my care of the patient were reviewed by me and considered in my medical decision making (see chart for details).     I reviewed exam  and symptoms with patient.  Discussed limitations and abilities of urgent care.  Wet read  of x-ray without obvious abnormalities.  EKG does show sinus rhythm cannot rule out anterior infarct.  Given patient history, symptoms and EKG I advised to go to the emergency room for further evaluation and treatment.  He is in agreement with plan will go POV to the emergency room.  He was instructed to pull over and call 911 for any worsening symptoms that occur in transit and he verbalized understanding. Final Clinical Impressions(s) / UC Diagnoses   Final diagnoses:  Right-sided chest pain  Abnormal EKG     Discharge Instructions      Please go to the emergency room for further evaluation of your symptoms     ED Prescriptions   None    PDMP not reviewed this encounter.   Loreda Myla SAUNDERS, NP 09/09/24 1820

## 2024-09-09 NOTE — ED Triage Notes (Signed)
 Pt c/o mid to RT chest pain with some radiation to back; sts he has had some GI issues lately and thought it might be gas; developed [facility] and was briefly lightheaded earlier; NAD at this time; was seen at [facility] and referred here

## 2024-09-09 NOTE — ED Notes (Addendum)
 Called lab to add on lipase and hepatic panel.  Lab confirmed same

## 2024-09-09 NOTE — Discharge Instructions (Addendum)
 Please go to the emergency room for further evaluation of your symptoms
